# Patient Record
Sex: Female | Born: 1965
Health system: Southern US, Community
[De-identification: ages and names within clinical notes are randomized; demographics above are authoritative.]

## PROBLEM LIST (undated history)

## (undated) DIAGNOSIS — Z1371 Encounter for nonprocreative screening for genetic disease carrier status: Secondary | ICD-10-CM

## (undated) DIAGNOSIS — E785 Hyperlipidemia, unspecified: Secondary | ICD-10-CM

## (undated) DIAGNOSIS — M1990 Unspecified osteoarthritis, unspecified site: Secondary | ICD-10-CM

## (undated) HISTORY — PX: CERVICAL FUSION: SHX112

## (undated) HISTORY — DX: Unspecified osteoarthritis, unspecified site: M19.90

## (undated) HISTORY — DX: Hyperlipidemia, unspecified: E78.5

## (undated) HISTORY — DX: Encounter for nonprocreative screening for genetic disease carrier status: Z13.71

---

## 1997-09-18 ENCOUNTER — Other Ambulatory Visit: Admission: RE | Admit: 1997-09-18 | Discharge: 1997-09-18 | Payer: Self-pay | Admitting: Obstetrics and Gynecology

## 1997-12-13 ENCOUNTER — Inpatient Hospital Stay (HOSPITAL_COMMUNITY): Admission: AD | Admit: 1997-12-13 | Discharge: 1997-12-16 | Payer: Self-pay | Admitting: Gynecology

## 1998-01-30 ENCOUNTER — Other Ambulatory Visit: Admission: RE | Admit: 1998-01-30 | Discharge: 1998-01-30 | Payer: Self-pay | Admitting: Gynecology

## 1999-07-17 ENCOUNTER — Other Ambulatory Visit: Admission: RE | Admit: 1999-07-17 | Discharge: 1999-07-17 | Payer: Self-pay | Admitting: Gynecology

## 2000-05-26 ENCOUNTER — Other Ambulatory Visit: Admission: RE | Admit: 2000-05-26 | Discharge: 2000-05-26 | Payer: Self-pay | Admitting: Gynecology

## 2000-08-22 ENCOUNTER — Inpatient Hospital Stay (HOSPITAL_COMMUNITY): Admission: AD | Admit: 2000-08-22 | Discharge: 2000-08-22 | Payer: Self-pay | Admitting: Gynecology

## 2000-10-28 ENCOUNTER — Encounter: Payer: Self-pay | Admitting: Gynecology

## 2000-10-29 ENCOUNTER — Inpatient Hospital Stay (HOSPITAL_COMMUNITY): Admission: AD | Admit: 2000-10-29 | Discharge: 2000-10-30 | Payer: Self-pay | Admitting: Gynecology

## 2000-11-01 ENCOUNTER — Inpatient Hospital Stay (HOSPITAL_COMMUNITY): Admission: AD | Admit: 2000-11-01 | Discharge: 2000-11-01 | Payer: Self-pay | Admitting: Obstetrics

## 2000-12-02 ENCOUNTER — Inpatient Hospital Stay (HOSPITAL_COMMUNITY): Admission: AD | Admit: 2000-12-02 | Discharge: 2000-12-05 | Payer: Self-pay | Admitting: Gynecology

## 2001-01-18 ENCOUNTER — Other Ambulatory Visit: Admission: RE | Admit: 2001-01-18 | Discharge: 2001-01-18 | Payer: Self-pay | Admitting: Gynecology

## 2002-01-22 ENCOUNTER — Other Ambulatory Visit: Admission: RE | Admit: 2002-01-22 | Discharge: 2002-01-22 | Payer: Self-pay | Admitting: Gynecology

## 2003-07-15 ENCOUNTER — Other Ambulatory Visit: Admission: RE | Admit: 2003-07-15 | Discharge: 2003-07-15 | Payer: Self-pay | Admitting: Gynecology

## 2004-09-30 ENCOUNTER — Other Ambulatory Visit: Admission: RE | Admit: 2004-09-30 | Discharge: 2004-09-30 | Payer: Self-pay | Admitting: Gynecology

## 2006-02-15 ENCOUNTER — Other Ambulatory Visit: Admission: RE | Admit: 2006-02-15 | Discharge: 2006-02-15 | Payer: Self-pay | Admitting: Gynecology

## 2007-11-20 ENCOUNTER — Other Ambulatory Visit: Admission: RE | Admit: 2007-11-20 | Discharge: 2007-11-20 | Payer: Self-pay | Admitting: Gynecology

## 2008-02-28 ENCOUNTER — Ambulatory Visit: Payer: Self-pay | Admitting: Internal Medicine

## 2008-11-14 ENCOUNTER — Ambulatory Visit: Payer: Self-pay | Admitting: Family Medicine

## 2008-11-14 DIAGNOSIS — R21 Rash and other nonspecific skin eruption: Secondary | ICD-10-CM | POA: Insufficient documentation

## 2008-11-14 DIAGNOSIS — K589 Irritable bowel syndrome without diarrhea: Secondary | ICD-10-CM | POA: Insufficient documentation

## 2008-11-29 ENCOUNTER — Encounter: Payer: Self-pay | Admitting: Gynecology

## 2008-11-29 ENCOUNTER — Ambulatory Visit: Payer: Self-pay | Admitting: Gynecology

## 2008-11-29 ENCOUNTER — Other Ambulatory Visit: Admission: RE | Admit: 2008-11-29 | Discharge: 2008-11-29 | Payer: Self-pay | Admitting: Gynecology

## 2010-02-25 ENCOUNTER — Other Ambulatory Visit
Admission: RE | Admit: 2010-02-25 | Discharge: 2010-02-25 | Payer: Self-pay | Source: Home / Self Care | Admitting: Gynecology

## 2010-02-25 ENCOUNTER — Ambulatory Visit: Payer: Self-pay | Admitting: Gynecology

## 2010-03-18 ENCOUNTER — Ambulatory Visit: Payer: Self-pay | Admitting: Gynecology

## 2010-08-14 NOTE — Discharge Summary (Signed)
[facility]  Patient:    Lauren Bernard, Lauren Bernard Visit Number: [id number] MRN: [id number]          Service Type: OBS Location: 910A 9130 01 Attending Physician:  Tonye Royalty Dictated by:   Antony Contras, N.P. Admit Date:  12/02/2000 Discharge Date: 12/05/2000                             Discharge Summary  DISCHARGE DIAGNOSES:          Intrauterine pregnancy at 36 4/7 weeks with abdominopelvic pressure and advanced cervical dilatation.  PROCEDURE:                    Normal spontaneous vaginal delivery of viable infant over intact perineum with repair of first degree laceration.  HISTORY OF PRESENT ILLNESS:   Patient is a 45 year old gravida 2, para 1-0-0-1 with LMP March 22, 2000, [facility] December 27, 2000.  Prenatal risk factors: Patient is Rh negative.  PRENATAL LABORATORIES:        Blood type O-.  Antibody screen negative.  RPR, HBSAG, HIV nonreactive.  MSAFP normal.  HOSPITAL COURSE:              Patient was admitted on December 02, 2000 secondary to advanced cervical dilatation and abdominopelvic pressure.  Cervix was 3 cm, 80-90% effaced, -1 station with bulging membranes and bloody show. She did progress to complete dilatation.  Delivered an Apgar 9/9 8 pound female infant over an intact perineum with repair of first degree laceration. Postpartum she remained afebrile.  Had no difficulty voiding.  Was able to be discharged on her second postpartum day.  Baby was A+ so she did receive RhoGAM prior to discharge.  LABORATORIES:                 CBC:  Hematocrit 27.1, hemoglobin 9.3, platelets 147,000.  DISPOSITION:                  Follow-up in six weeks.  Continue prenatal vitamins and iron, Motrin and Tylox for pain. Dictated by:   Antony Contras, N.P. Attending Physician:  Tonye Royalty DD:  12/23/00 TD:  12/23/00 Job: [phone] [id number]

## 2010-08-14 NOTE — H&P (Signed)
[facility]  PatientROMAN, SANDALL Visit Number: [id number] MRN: [id number]          Service Type: Attending:  Gaetano Hawthorne. Lily Peer, M.D. Dictated by:   Gaetano Hawthorne Lily Peer, M.D. Adm. Date:  12/02/00                           History and Physical  CHIEF COMPLAINT:              Abdominopelvic pressure and advanced cervical dilatation.  HISTORY:                      The patient is a 45 year old gravida 2, para 1. Last menstrual period March 22, 2000.  Estimated date of confinement December 27, 2000.  The patient currently 36-4/[redacted] weeks gestation.  Presented to the office today after calling early this morning that she had been complaining of low abdominopelvic pressure.  She lives approximately an hour and a half from the hospital and had been seen in the office on September 5 and pelvic examination at that time had demonstrated her cervix was 1 cm, 80%, effaced, posterior, -1 station.  The patient has a history of macrosomic infant.  Last pregnancy delivered at 37 weeks of an 8 pound 11 ounce baby. Today, she was placed on the monitor this morning and she was now found to be contracting and her fetal heart rate tracing was reassuring.  She was asked to come back this afternoon to reexamine her cervix.  In the morning, it was 2 cm, and now this afternoon had changed to 3+, 80-90%, and an -1 station with intact membranes and bloody show was evident.  The patient yesterday had a group B Strep culture, pending at time of this dictation.  The patients prenatal course had otherwise been uneventful.  She did receive RhoGAM at [redacted] weeks gestation.  ALLERGIES:                    AMOXICILLIN.  PAST MEDICAL HISTORY:         She had a normal spontaneous vaginal delivery at [redacted] weeks gestation in 1999, 8 pounds 11 ounce baby.  Otherwise, benign past medical history.  REVIEW OF SYSTEMS:            See hospital form.  PHYSICAL EXAMINATION:  GENERAL:                       Well-developed, well-nourished female.  HEENT:                        Unremarkable.  NECK:                         Supple.  Trachea midline.  No carotid bruits, no thyromegaly.  LUNGS:                        Clear to auscultation without rhonchi or wheezes.  HEART:                        Regular rate and rhythm.  No murmurs of gallops.  BREASTS:                      Exam not done.  ABDOMEN:  Gravid uterus.  Fundal height approximately 38-39 cm.  Vertex presentation by [facility] maneuver.  Positive fetal heart tones.  PELVIC:                       Cervix 3+ cm, 80-90% effaced, -3 station, bloody show present, bulging membranes.  EXTREMITIES:                  DTRs 1+, negative clonus.  PRENATAL LABORATORY DATA:     Blood type O negative.  Negative antibody screen.  VDRL was nonreactive.  Rubella immune.  Hepatitis B surface antigen and HIV were negative.  ______ alpha-fetoprotein was normal.  Blood sugar screen was normal.  Pap smear was normal.  GBS culture pending at time of this dictation.  ASSESSMENT:                   A 45 year old gravida 2, para 1 at 36-4/[redacted] weeks gestation and complaining of persistent low abdominopelvic pressure with evidence of advanced cervical dilatation from this mornings exam to this afternoon, now 3+ cm, 80-90% effaced, -1 station with bulging membranes and bloody show.  GBS culture obtained yesterday pending at time of this dictation.  The patient monitored and no evidence of contractions earlier but reassuring tracing.  The patient will be admitted to [facility], will be monitored, possibly could proceed with initiation of labor due to the fact that geographical distance is over an hour and a half to the hospital and she has changed her cervix significantly and, with her symptoms, warrants delivery, such as rupture of membranes and Pitocin augmentation as indicated. I have explained to the patient that she is  36-1/2 weeks, almost 37 weeks, that term pregnancy is considered between 36-[redacted] weeks gestation, that there is always a risk of the baby being delivered prematurely and having lung immaturity but she has good dates and has had normal diabetes screen and that more than likely this baby will do well.  The patient feels comfortable with that and we will go ahead and admit, and if her GBS cultures are not available at this time, we will go ahead and place her on a penicillin G protocol for prophylaxis.  PLAN:                         Process with above. Dictated by:   Gaetano Hawthorne Lily Peer, M.D. Attending:  Gaetano Hawthorne. Lily Peer, M.D. DD:  12/02/00 TD:  12/02/00 Job: [id number]

## 2010-08-14 NOTE — H&P (Signed)
[facility]  Patient:    Lauren Bernard, Lauren Bernard                        MRN: [id number] Adm. Date:  [id number] Attending:  Merrily Pew                         History and Physical  CHIEF COMPLAINT:              Third trimester bleeding.  HISTORY OF PRESENT ILLNESS:   A 45 year old, G2, P66 female at 31+ weeks who enters complaining of asymptomatic acute vaginal bleeding.  The patient relates unprovoked episode of bright red bleeding per vagina like a menstrual period.  No associated cramping pain with this and no precipitating events as far as falls or injuries.  The patient had no recent intercourse or any other identifiable cause.  Her pregnancy has been uncomplicated to date, and she reports a history to have had a normal screening ultrasound showing no placental abnormalities or previa.  PAST MEDICAL HISTORY:         IBS.  PAST SURGICAL HISTORY:        Wisdom teeth.  ALLERGIES:                    AMOXICILLIN causing rash.  REVIEW OF SYSTEMS:            Noncontributory.  MEDICATIONS:                  Prenatal vitamins and iron.  SOCIAL HISTORY:               Noncontributory.  No cigarette or alcohol use.  ADMISSION PHYSICAL EXAMINATION:  VITAL SIGNS:                  Afebrile.  Vital signs are stable.  HEENT:                        Normal.  LUNGS:                        Clear.  CARDIAC:                      Regular rate without rubs, murmurs, or gallops.  ABDOMEN:                      Uterus appropriate for dates, soft, nontender. External monitor showing no contractions with a reactive fetal tracing.  PELVIC:                       On peroneal inspection, no blood.  Gentle digital exam reveals menses type staining.  With gentle cervix palpation, closed with palpable length.  ASSESSMENT:                   A 45 year old, G2, P63 female at 78 weeks with third trimester bleeding, not actively bleeding now but with evidence of blood on  exam.  No provoked etiology and otherwise asymptomatic.  Differential includes abruption, although the patient is not having pain or contractions. Second is abnormality in placenta location to include possible succenturiate lobe versus marginal previa.  The third being cervical with possible rupture of a cervical vessel.  PLAN:  The patient is to be admitted for observation and continued fetal uterine monitoring, CBC baseline, Kleihauer-Betke type and Rh.  An ultrasound for placenta location and assessment for abruption. Discussed plan with patient who agrees, and her questions were answered. DD:  10/28/00 TD:  10/28/00 Job: [id number]

## 2012-03-10 ENCOUNTER — Encounter: Payer: Self-pay | Admitting: Gynecology

## 2012-03-30 ENCOUNTER — Encounter: Payer: Self-pay | Admitting: Gynecology

## 2012-04-11 ENCOUNTER — Encounter: Payer: Self-pay | Admitting: Gynecology

## 2012-04-28 ENCOUNTER — Encounter: Payer: Self-pay | Admitting: Gynecology

## 2012-04-28 ENCOUNTER — Ambulatory Visit (INDEPENDENT_AMBULATORY_CARE_PROVIDER_SITE_OTHER): Payer: BC Managed Care – PPO | Admitting: Gynecology

## 2012-04-28 ENCOUNTER — Other Ambulatory Visit (HOSPITAL_COMMUNITY)
Admission: RE | Admit: 2012-04-28 | Discharge: 2012-04-28 | Disposition: A | Discharge status: Home / Self Care | Payer: BC Managed Care – PPO | Source: Ambulatory Visit | Attending: Gynecology | Admitting: Gynecology

## 2012-04-28 VITALS — BP 120/72 | Ht 63.0 in | Wt 132.0 lb

## 2012-04-28 DIAGNOSIS — Z01419 Encounter for gynecological examination (general) (routine) without abnormal findings: Secondary | ICD-10-CM

## 2012-04-28 DIAGNOSIS — Z13220 Encounter for screening for lipoid disorders: Secondary | ICD-10-CM

## 2012-04-28 DIAGNOSIS — Z1151 Encounter for screening for human papillomavirus (HPV): Secondary | ICD-10-CM | POA: Insufficient documentation

## 2012-04-28 DIAGNOSIS — F411 Generalized anxiety disorder: Secondary | ICD-10-CM

## 2012-04-28 DIAGNOSIS — F418 Other specified anxiety disorders: Secondary | ICD-10-CM

## 2012-04-28 DIAGNOSIS — M1990 Unspecified osteoarthritis, unspecified site: Secondary | ICD-10-CM | POA: Insufficient documentation

## 2012-04-28 LAB — COMPREHENSIVE METABOLIC PANEL
ALT: 22 U/L (ref 0–35)
Albumin: 4.4 g/dL (ref 3.5–5.2)
Alkaline Phosphatase: 67 U/L (ref 39–117)
CO2: 27 mEq/L (ref 19–32)
Calcium: 9.4 mg/dL (ref 8.4–10.5)
Chloride: 105 mEq/L (ref 96–112)
Potassium: 4 mEq/L (ref 3.5–5.3)
Sodium: 139 mEq/L (ref 135–145)
Total Bilirubin: 0.8 mg/dL (ref 0.3–1.2)
Total Protein: 6.6 g/dL (ref 6.0–8.3)

## 2012-04-28 LAB — CBC WITH DIFFERENTIAL/PLATELET
Basophils Absolute: 0 10*3/uL (ref 0.0–0.1)
Basophils Relative: 0 % (ref 0–1)
Eosinophils Absolute: 0 10*3/uL (ref 0.0–0.7)
Eosinophils Relative: 1 % (ref 0–5)
HCT: 36.9 % (ref 36.0–46.0)
Hemoglobin: 12.8 g/dL (ref 12.0–15.0)
Lymphocytes Relative: 31 % (ref 12–46)
Lymphs Abs: 2.1 10*3/uL (ref 0.7–4.0)
MCH: 31.2 pg (ref 26.0–34.0)
MCHC: 34.7 g/dL (ref 30.0–36.0)
MCV: 90 fL (ref 78.0–100.0)
Monocytes Absolute: 0.3 10*3/uL (ref 0.1–1.0)
Monocytes Relative: 4 % (ref 3–12)
Neutro Abs: 4.5 10*3/uL (ref 1.7–7.7)
Neutrophils Relative %: 64 % (ref 43–77)
Platelets: 292 10*3/uL (ref 150–400)
RBC: 4.1 MIL/uL (ref 3.87–5.11)
RDW: 12.8 % (ref 11.5–15.5)
WBC: 7 10*3/uL (ref 4.0–10.5)

## 2012-04-28 LAB — LIPID PANEL
Cholesterol: 179 mg/dL (ref 0–200)
HDL: 43 mg/dL (ref >39)
LDL Cholesterol: 116 mg/dL — ABNORMAL HIGH (ref 0–99)
Triglycerides: 98 mg/dL (ref <150)
VLDL: 20 mg/dL (ref 0–40)

## 2012-04-28 MED ORDER — SERTRALINE HCL 50 MG PO TABS: 50.0000 mg | ORAL_TABLET | Freq: Every day | ORAL | Status: DC

## 2012-04-28 NOTE — Progress Notes (Signed)
Lauren Bernard March 17, 1966 [id number]        47 y.o.  G2P2002 for annual exam.  Has not been in for 2 years. Several issues noted below.  Past medical history,surgical history, medications, allergies, family history and social history were all reviewed and documented in the EPIC chart. ROS:  Was performed and pertinent positives and negatives are included in the history.  Exam: Kim assistant Filed Vitals:   04/28/12 1047  BP: 120/72  Height: 5\' 3"  (1.6 m)  Weight: 132 lb (59.875 kg)   General appearance  Normal Skin grossly normal Head/Neck normal with no cervical or supraclavicular adenopathy thyroid normal Lungs  clear Cardiac RR, without RMG Abdominal  soft, nontender, without masses, organomegaly or hernia Breasts  examined lying and sitting without masses, retractions, discharge or axillary adenopathy. Pelvic  Ext/BUS/vagina  normal   Cervix  normal Pap/HPV  Uterus  anteverted, normal size, shape and contour, midline and mobile nontender   Adnexa  Without masses or tenderness    Anus and perineum  normal   Rectovaginal  normal sphincter tone without palpated masses or tenderness.    Assessment/Plan:  47 y.o. G6P2002 female for annual exam regular menses, condom birth control.   1. Contraception management. I reviewed options for contraception and again discussed the failure risk with condoms. Availability of plan B also reviewed. Patient is not interested in trying anything else. She understands and accepts the failure risks. 2. Situational anxiety. Patient's having a lot of issues with situational anxiety to include a brother who was recently hospitalized for drug abuse and issues at work. Had been on Zoloft in the past around the time of her mother's death and did well with this and asked if she could restart this. I reviewed the risks benefits to include suicide ideations. She understands and accepts and we'll start with Zoloft 50 mg daily #30 refill x11. Recommended she stay  on this at least 6 months to year and then we can reevaluate at that time. 3. Mammography 2012. Is arranging her mammogram now.  I recommended 3-D mammography. Her mother developed breast cancer at age 44 and died at 25. Her mother's sister developed breast cancer at age 60 is alive and well after double mastectomy. We have discussed in the past and I rediscussed today genetic testing for BRCA carrier status. I offered to draw that today she declined at this time. I strongly recommended she consider genetic counseling to assess her risk not only for BRCA testing but also MRI screening and if she is above a 15 to 20% risk to consider annual MRI. Patient gets her studies at [facility] and said that she will call there and arranged to talk to a genetic counselor. She does understand 3-D mammography is not to replace MRI screening. SBE monthly also reviewed. 4. Pap smear 2011. Pap/HPV done today. No history of abnormal Pap smears previously and if this is normal we'll plan less frequent screening every 3-5 years. 5. Health maintenance. Baseline CBC lipid profile comprehensive metabolic panel urinalysis ordered. Follow up in one year, sooner as needed.    Dara Lords MD, 12:01 PM 04/28/2012

## 2012-04-28 NOTE — Patient Instructions (Signed)
Start on Zoloft as we discussed. Call me if they have any issues with this. Consider genetic testing for breast cancer gene or talking to a genetic counselor as far as getting tested as well as doing MRIs.  Would recommend 3-D mammography regardless. Follow up in one year for annual exam

## 2012-04-29 LAB — URINALYSIS W MICROSCOPIC + REFLEX CULTURE
Bacteria, UA: NONE SEEN
Bilirubin Urine: NEGATIVE
Casts: NONE SEEN
Crystals: NONE SEEN
Glucose, UA: NEGATIVE mg/dL
Hgb urine dipstick: NEGATIVE
Ketones, ur: NEGATIVE mg/dL
Leukocytes, UA: NEGATIVE
Nitrite: NEGATIVE
Protein, ur: NEGATIVE mg/dL
Specific Gravity, Urine: <1.005 — ABNORMAL LOW (ref 1.005–1.030)
Squamous Epithelial / LPF: NONE SEEN
Urobilinogen, UA: 0.2 mg/dL (ref 0.0–1.0)
pH: 7.5 (ref 5.0–8.0)

## 2012-05-01 ENCOUNTER — Telehealth: Payer: Self-pay | Admitting: *Deleted

## 2012-05-01 NOTE — Telephone Encounter (Signed)
Mammogram order faxed to [facility] for pt @ [phone]. Pt informed as well.

## 2012-05-02 ENCOUNTER — Other Ambulatory Visit: Payer: Self-pay

## 2012-05-02 ENCOUNTER — Other Ambulatory Visit: Payer: Self-pay | Admitting: Gynecology

## 2012-05-02 MED ORDER — FLUCONAZOLE 150 MG PO TABS: 150.0000 mg | ORAL_TABLET | Freq: Once | ORAL | Status: DC

## 2012-05-19 ENCOUNTER — Encounter: Payer: Self-pay | Admitting: Gynecology

## 2013-04-18 ENCOUNTER — Ambulatory Visit (INDEPENDENT_AMBULATORY_CARE_PROVIDER_SITE_OTHER): Payer: BC Managed Care – PPO | Admitting: Gynecology

## 2013-04-18 ENCOUNTER — Encounter: Payer: Self-pay | Admitting: Gynecology

## 2013-04-18 DIAGNOSIS — M545 Low back pain, unspecified: Secondary | ICD-10-CM

## 2013-04-18 LAB — URINALYSIS W MICROSCOPIC + REFLEX CULTURE
Bilirubin Urine: NEGATIVE
Casts: NONE SEEN
Crystals: NONE SEEN
Glucose, UA: NEGATIVE mg/dL
KETONES UR: NEGATIVE mg/dL
Leukocytes, UA: NEGATIVE
NITRITE: NEGATIVE
Protein, ur: NEGATIVE mg/dL
RBC / HPF: NONE SEEN RBC/hpf (ref <3)
Specific Gravity, Urine: <1.005 — ABNORMAL LOW (ref 1.005–1.030)
UROBILINOGEN UA: 0.2 mg/dL (ref 0.0–1.0)
WBC, UA: NONE SEEN WBC/hpf (ref <3)
pH: 5 (ref 5.0–8.0)

## 2013-04-18 MED ORDER — SULFAMETHOXAZOLE-TMP DS 800-160 MG PO TABS: 1.0000 | ORAL_TABLET | Freq: Two times a day (BID) | ORAL | Status: DC

## 2013-04-18 NOTE — Progress Notes (Signed)
Patient presents with several day history of low back pain and malaise. No temperatures. Slight right lower quadrant discomfort. No nausea vomiting diarrhea or constipation. No dysuria frequency urgency. Feels like she may be getting an early UTI. Has had similar symptoms in the past.  Exam was [employer] Spine straight without CVA tenderness. Abdomen soft nontender without masses guarding rebound organomegaly. Pelvic external BUS vagina normal. Cervix normal. Uterus retroverted grossly normal size midline mobile nontender. Adnexa without masses or tenderness.  Assessment and plan: Ill-defined symptoms. Urinalysis does show rare bacteria with trace blood. Will check culture. Start on Septra DS 1 by mouth twice a day x3 days. If symptoms persist patient will call and we'll start with ultrasound to rule out nonpalpable pelvic pathology. If symptoms totally cleared and she is going to followup in February when she is due for her annual exam.

## 2013-04-18 NOTE — Patient Instructions (Signed)
Take Septra antibiotic twice daily for 3 days. If discomfort or symptoms persist call and we will schedule an ultrasound. If the symptoms totally disappeared annual followup in February when you are due for your annual exam.

## 2013-04-19 ENCOUNTER — Telehealth: Payer: Self-pay | Admitting: *Deleted

## 2013-04-19 LAB — URINE CULTURE
COLONY COUNT: NO GROWTH
ORGANISM ID, BACTERIA: NO GROWTH

## 2013-04-19 NOTE — Telephone Encounter (Signed)
Pt calling to follow up from [place] 04/18/13 took Septra DS 1 by mouth twice a day x3 still having lower back discomfort and pressure on right side. Please advise

## 2013-04-19 NOTE — Telephone Encounter (Signed)
Pt gave DPR access so I left the below on pt voicemail.

## 2013-04-19 NOTE — Telephone Encounter (Signed)
She is to take Septra twice daily for 3 days and she started that yesterday she should still have 2 days left. I told her that that would take her into the weekend if her pain still continued on Monday to call and we would schedule an ultrasound. Her urine culture is still not back yet. If she starts feeling worse in the interim that she would need to be seen at an emergency facility like urgent care or emergency room

## 2013-04-23 ENCOUNTER — Telehealth: Payer: Self-pay | Admitting: *Deleted

## 2013-04-23 DIAGNOSIS — R1031 Right lower quadrant pain: Secondary | ICD-10-CM

## 2013-04-23 NOTE — Telephone Encounter (Signed)
Okay to schedule ultrasound 

## 2013-04-23 NOTE — Telephone Encounter (Signed)
Pt informed, front desk to call, order placed

## 2013-04-23 NOTE — Telephone Encounter (Signed)
Pt called to follow up from telephone encounter 04/19/13 "I told her that that would take her into the weekend if her pain still continued on Monday to call and we would schedule an ultrasound. Patient is still having pain ultrasound will be scheduled. Lauren Bernard

## 2013-04-25 ENCOUNTER — Other Ambulatory Visit: Payer: Self-pay | Admitting: Gynecology

## 2013-04-25 ENCOUNTER — Ambulatory Visit (INDEPENDENT_AMBULATORY_CARE_PROVIDER_SITE_OTHER): Payer: BC Managed Care – PPO | Admitting: Gynecology

## 2013-04-25 ENCOUNTER — Ambulatory Visit (INDEPENDENT_AMBULATORY_CARE_PROVIDER_SITE_OTHER): Payer: BC Managed Care – PPO

## 2013-04-25 ENCOUNTER — Encounter: Payer: Self-pay | Admitting: Gynecology

## 2013-04-25 DIAGNOSIS — R9389 Abnormal findings on diagnostic imaging of other specified body structures: Secondary | ICD-10-CM

## 2013-04-25 DIAGNOSIS — R188 Other ascites: Secondary | ICD-10-CM

## 2013-04-25 DIAGNOSIS — N839 Noninflammatory disorder of ovary, fallopian tube and broad ligament, unspecified: Secondary | ICD-10-CM

## 2013-04-25 DIAGNOSIS — R1031 Right lower quadrant pain: Secondary | ICD-10-CM

## 2013-04-25 DIAGNOSIS — N83209 Unspecified ovarian cyst, unspecified side: Secondary | ICD-10-CM

## 2013-04-25 DIAGNOSIS — D259 Leiomyoma of uterus, unspecified: Secondary | ICD-10-CM

## 2013-04-25 DIAGNOSIS — D251 Intramural leiomyoma of uterus: Secondary | ICD-10-CM

## 2013-04-25 DIAGNOSIS — R102 Pelvic and perineal pain: Secondary | ICD-10-CM

## 2013-04-25 DIAGNOSIS — N831 Corpus luteum cyst of ovary, unspecified side: Secondary | ICD-10-CM

## 2013-04-25 DIAGNOSIS — N949 Unspecified condition associated with female genital organs and menstrual cycle: Secondary | ICD-10-CM

## 2013-04-25 LAB — CBC WITH DIFFERENTIAL/PLATELET
Basophils Absolute: 0.1 10*3/uL (ref 0.0–0.1)
Basophils Relative: 1 % (ref 0–1)
EOS ABS: 0.1 10*3/uL (ref 0.0–0.7)
Eosinophils Relative: 1 % (ref 0–5)
HCT: 38.1 % (ref 36.0–46.0)
Hemoglobin: 13.2 g/dL (ref 12.0–15.0)
LYMPHS ABS: 2.1 10*3/uL (ref 0.7–4.0)
LYMPHS PCT: 35 % (ref 12–46)
MCH: 31.1 pg (ref 26.0–34.0)
MCHC: 34.6 g/dL (ref 30.0–36.0)
MCV: 89.9 fL (ref 78.0–100.0)
Monocytes Absolute: 0.3 10*3/uL (ref 0.1–1.0)
Monocytes Relative: 5 % (ref 3–12)
NEUTROS ABS: 3.5 10*3/uL (ref 1.7–7.7)
NEUTROS PCT: 58 % (ref 43–77)
PLATELETS: 320 10*3/uL (ref 150–400)
RBC: 4.24 MIL/uL (ref 3.87–5.11)
RDW: 12.7 % (ref 11.5–15.5)
WBC: 6.1 10*3/uL (ref 4.0–10.5)

## 2013-04-25 MED ORDER — IBUPROFEN 800 MG PO TABS: 800.0000 mg | ORAL_TABLET | Freq: Three times a day (TID) | ORAL | Status: DC | PRN

## 2013-04-25 NOTE — Patient Instructions (Signed)
Start on prescription strength ibuprofen every 8 hours. Followup next week for annual exam. We will plan on repeating the ultrasound in several months just to make sure the ovarian cystic changes resolve.

## 2013-04-25 NOTE — Progress Notes (Signed)
Patient follows up having had been evaluated last week with low back pain and malaise. Was started on Septra for presumed UTI although her urine did not grow out any bacteria ultimately. Her low back pain radiating to her pelvis has continued and she called and we scheduled an ultrasound. Regular menses. No irregular bleeding. No fever chills nausea vomiting diarrhea or constipation. No urinary symptoms. Does note she is feeling a little bit better since last week.  Ultrasound shows uterus normal size. Endometrial echo generous at 24.9 mm. Right ovary with thick cystic reticular mass consistent with a hemorrhagic cyst 24 x 23 x 20 mm. Avascular. Left ovary with thick walled cyst peripheral flow 23 x 22 x 19 consistent with corpus luteum. Moderate amount of free fluid in the cul-de-sac.  Exam Spine straight without CVA tenderness. Abdomen soft nontender without masses guarding rebound organomegaly.  Assessment and plan: Low back pain with pelvic discomfort. Suspect ruptured ovarian cyst. Ibuprofen 800 mg #30 refill x1 every 8 hours. Check baseline labs to include CBC comprehensive metabolic panel hCG and repeat urinalysis. Patient has appointment for her annual next week and will followup for this assuming she continues to feel better. Will plan on repeating her ultrasound in several cycles just to make sure the endometrial echo thins and the cystic changes resolved.

## 2013-04-26 LAB — COMPREHENSIVE METABOLIC PANEL
ALT: 13 U/L (ref 0–35)
AST: 18 U/L (ref 0–37)
Albumin: 4.6 g/dL (ref 3.5–5.2)
Alkaline Phosphatase: 63 U/L (ref 39–117)
BUN: 12 mg/dL (ref 6–23)
CALCIUM: 9.3 mg/dL (ref 8.4–10.5)
CHLORIDE: 103 meq/L (ref 96–112)
CO2: 27 mEq/L (ref 19–32)
CREATININE: 0.78 mg/dL (ref 0.50–1.10)
Glucose, Bld: 91 mg/dL (ref 70–99)
POTASSIUM: 4.5 meq/L (ref 3.5–5.3)
Sodium: 138 mEq/L (ref 135–145)
Total Bilirubin: 0.5 mg/dL (ref 0.2–1.2)
Total Protein: 7.1 g/dL (ref 6.0–8.3)

## 2013-04-26 LAB — URINALYSIS W MICROSCOPIC + REFLEX CULTURE
Bacteria, UA: NONE SEEN
Bilirubin Urine: NEGATIVE
Casts: NONE SEEN
Crystals: NONE SEEN
GLUCOSE, UA: NEGATIVE mg/dL
Hgb urine dipstick: NEGATIVE
Ketones, ur: NEGATIVE mg/dL
LEUKOCYTES UA: NEGATIVE
Nitrite: NEGATIVE
PROTEIN: NEGATIVE mg/dL
Squamous Epithelial / LPF: NONE SEEN
Urobilinogen, UA: 0.2 mg/dL (ref 0.0–1.0)
pH: 7 (ref 5.0–8.0)

## 2013-04-26 LAB — HCG, SERUM, QUALITATIVE: PREG SERUM: NEGATIVE

## 2013-05-02 ENCOUNTER — Ambulatory Visit (INDEPENDENT_AMBULATORY_CARE_PROVIDER_SITE_OTHER): Payer: BC Managed Care – PPO | Admitting: Gynecology

## 2013-05-02 ENCOUNTER — Encounter: Payer: Self-pay | Admitting: Gynecology

## 2013-05-02 VITALS — BP 120/76 | Ht 63.0 in | Wt 148.0 lb

## 2013-05-02 DIAGNOSIS — Z01419 Encounter for gynecological examination (general) (routine) without abnormal findings: Secondary | ICD-10-CM

## 2013-05-02 LAB — LIPID PANEL
Cholesterol: 197 mg/dL (ref 0–200)
HDL: 49 mg/dL (ref >39)
LDL Cholesterol: 128 mg/dL — ABNORMAL HIGH (ref 0–99)
TRIGLYCERIDES: 101 mg/dL (ref <150)
Total CHOL/HDL Ratio: 4 Ratio
VLDL: 20 mg/dL (ref 0–40)

## 2013-05-02 NOTE — Patient Instructions (Addendum)
Office will call to arrange genetic testing  Followup for ultrasound in 2 months  Call to Schedule your mammogram  Facilities in [place]: 1)  [facility], [place] Phone: [phone] 2)  [facility]. [address] [place] Phone: [phone] 3)  Dr. Isaiah Blakes at [facility] [address] Phone: [phone]     Mammogram A mammogram is an X-ray test to find changes in a woman's breast. You should get a mammogram if:  You are 36 years of age or older  You have risk factors.   Your doctor recommends that you have one.  BEFORE THE TEST  Do not schedule the test the week before your period, especially if your breasts are sore during this time.  On the day of your mammogram:  Wash your breasts and armpits well. After washing, do not put on any deodorant or talcum powder on until after your test.   Eat and drink as you usually do.   Take your medicines as usual.   If you are diabetic and take insulin, make sure you:   Eat before coming for your test.   Take your insulin as usual.   If you cannot keep your appointment, call before the appointment to cancel. Schedule another appointment.  TEST  You will need to undress from the waist up. You will put on a hospital gown.   Your breast will be put on the mammogram machine, and it will press firmly on your breast with a piece of plastic called a compression paddle. This will make your breast flatter so that the machine can X-ray all parts of your breast.   Both breasts will be X-rayed. Each breast will be X-rayed from above and from the side. An X-ray might need to be taken again if the picture is not good enough.   The mammogram will last about 15 to 30 minutes.  AFTER THE TEST Finding out the results of your test Ask when your test results will be ready. Make sure you get your test results.  Document Released:  06/11/2008 Document Revised: 03/04/2011 Document Reviewed: 06/11/2008 [facility] Patient Information 2012 [place].

## 2013-05-02 NOTE — Progress Notes (Signed)
MAHEALANI SULAK 11-27-1965 [id number]        48 y.o.  G2P2002 for annual exam.  Several issues noted below.  Past medical history,surgical history, problem list, medications, allergies, family history and social history were all reviewed and documented in the EPIC chart.  ROS:  Performed and pertinent positives and negatives are included in the history, assessment and plan .  Exam: Kim assistant Filed Vitals:   05/02/13 0854  BP: 120/76  Height: 5\' 3"  (1.6 m)  Weight: 148 lb (67.132 kg)   General appearance  Normal Skin grossly normal Head/Neck normal with no cervical or supraclavicular adenopathy thyroid normal Lungs  clear Cardiac RR, without RMG Abdominal  soft, nontender, without masses, organomegaly or hernia Breasts  examined lying and sitting without masses, retractions, discharge or axillary adenopathy. Pelvic  Ext/BUS/vagina  Normal  Cervix  Normal  Uterus  retroverted, normal size, shape and contour, midline and mobile nontender   Adnexa  Without masses or tenderness    Anus and perineum  Normal   Rectovaginal  Normal sphincter tone without palpated masses or tenderness.    Assessment/Plan:  48 y.o. G51P2002 female for annual exam regular menses, condom birth control.   1. Contraception. Patient continues with condoms and is comfortable with this. Risk of failure and availability of plan B. has been discussed with her on multiple occasions. 2. History of lower abdominal/pelvic pain recent evaluation negative. Ultrasound did show hemorrhagic appearing cyst and we felt it was secondary to this. Notes that her pain is getting better. Recommended followup ultrasound in 2 months to make sure the cyst resolves and she agrees to followup for this and schedule it. 3. Strong family history of breast cancer. Mother diagnosed age 48 and subsequently died. Maternal aunt diagnosed age 16. Previously discussed availability of genetic testing and whether to add MRI screening regardless.  Patient never followed up as we discussed last year. I again urged her to at least discuss with genetic counselor from a genetic testing as well as loculated breast cancer risk in MRI screening. Patient agrees to this and we'll arrange this through [facility] [place]. Mammography 04/2012. Continue with annual mammography with suggested 3-D. SBE monthly reviewed. 4. History of anxiety on Zoloft. Patient has weaned herself off and is doing well and will stay off of this. 5. Pap smear/HPV negative 2014. No history of significant abnormal Pap smears. Plan repeat at 3-5 year interval. 6. Health maintenance. Recently had CBC comprehensive metabolic panel urinalysis. Will check lipid profile today. Followup for ultrasound in 2 months otherwise annually.   Note: This document was prepared with digital dictation and possible smart phrase technology. Any transcriptional errors that result from this process are unintentional.   Anastasio Auerbach MD, 9:47 AM 05/02/2013

## 2013-05-03 ENCOUNTER — Telehealth: Payer: Self-pay | Admitting: *Deleted

## 2013-05-03 ENCOUNTER — Telehealth: Payer: Self-pay | Admitting: Genetic Counselor

## 2013-05-03 NOTE — Telephone Encounter (Signed)
S/W PT IN REF TO [facility]. ON 07/16/13@1 :00 MAILED NP PACKET

## 2013-05-03 NOTE — Telephone Encounter (Signed)
Message copied by Thamas Jaegers on Thu May 03, 2013  8:40 AM ------      Message from: Anastasio Auerbach      Created: Wed May 02, 2013  9:35 AM       Schedule appointment with genetic counselor at [facility] Center reference mother with history breast cancer age 46, maternal aunt breast cancer age 68 assess for genetic testing and breast cancer risk ------

## 2013-05-03 NOTE — Telephone Encounter (Signed)
LEFT PT VM IN REF TO [facility]. APPT.

## 2013-05-03 NOTE — Telephone Encounter (Signed)
Referral faxed to [facility], they will contact pt to schedule. 

## 2013-05-03 NOTE — Telephone Encounter (Signed)
Appt. 07/16/13 @ 1:00 pm

## 2013-06-26 ENCOUNTER — Telehealth: Payer: Self-pay | Admitting: *Deleted

## 2013-06-26 NOTE — Telephone Encounter (Signed)
Called and left message for pt to return my call so I can reschedule her genetic appt.

## 2013-07-11 ENCOUNTER — Encounter: Payer: Self-pay | Admitting: Gynecology

## 2013-07-11 ENCOUNTER — Other Ambulatory Visit: Payer: Self-pay | Admitting: Gynecology

## 2013-07-11 ENCOUNTER — Telehealth: Payer: Self-pay

## 2013-07-11 ENCOUNTER — Ambulatory Visit (INDEPENDENT_AMBULATORY_CARE_PROVIDER_SITE_OTHER): Payer: BC Managed Care – PPO | Admitting: Gynecology

## 2013-07-11 ENCOUNTER — Ambulatory Visit (INDEPENDENT_AMBULATORY_CARE_PROVIDER_SITE_OTHER): Payer: BC Managed Care – PPO

## 2013-07-11 DIAGNOSIS — R102 Pelvic and perineal pain unspecified side: Secondary | ICD-10-CM

## 2013-07-11 DIAGNOSIS — N83209 Unspecified ovarian cyst, unspecified side: Secondary | ICD-10-CM

## 2013-07-11 DIAGNOSIS — N831 Corpus luteum cyst of ovary, unspecified side: Secondary | ICD-10-CM

## 2013-07-11 DIAGNOSIS — Z01419 Encounter for gynecological examination (general) (routine) without abnormal findings: Secondary | ICD-10-CM

## 2013-07-11 DIAGNOSIS — N949 Unspecified condition associated with female genital organs and menstrual cycle: Secondary | ICD-10-CM

## 2013-07-11 NOTE — Progress Notes (Signed)
Lauren Bernard May 24, 1965 [id number]        48 y.o.  [id number] presents for followup ultrasound with history of low back pain and pelvic discomfort starting late last year. Had ultrasound in January 2015 which showed a hemorrhagic-appearing cyst on the right ovary 24 x 23 x 20 mm and a more thickwalled hemorrhagic-appearing cyst on the left ovary 23 x 22 x 19. We planned expectant management with repeat ultrasound in several months and she is here for that. She initially felt the pain was getting better but she still is having a nagging pelvic discomfort and more bloating type symptoms. Menses regular using condoms contraception. No nausea vomiting diarrhea constipation. No urinary symptoms such as urgency frequency dysuria.  Past medical history,surgical history, problem list, medications, allergies, family history and social history were all reviewed and documented in the EPIC chart.  Directed ROS to system complaints/issues with pertinent positives and negatives documented in the history of present illness/assessment and plan.  Ultrasound shows uterus normal size. Endometrial echo 18 mm. Small myoma 15 mm noted. Right ovary is normal with physiologic follicular change. Left ovary with thick walled cystic hemorrhagic-appearing cyst 36 x 36 x 30 mm low level internal echoes, avascular. Some fluid noted in the cul-de-sac.  Assessment/Plan:  48 y.o. G2P2002 persistent pelvic discomfort with ultrasound showing a persistent low level internal echo cyst on the left avascular slightly larger than before. Reviewed differential to include physiologic changes, endometriosis with endometrioma, other ovarian processes/tumor, non-gynecologic although no pointing symptoms to other organ systems. After likely discussion we both agree with proceeding with laparoscopy and removal of the left ovarian cyst. Assess pelvis for endometriosis. Will check urinalysis today and CA 125. Will represent for a preop consult before hand.  I discussed in general was involved with the surgery to include laparoscopic ports, risks of damage to internal organs infection and transfusion. She has a meeting scheduled with the genetic counselor in reference to her strong family history of breast cancer. I did discuss that if she would elect for genetic testing that we should wait and do the surgery after results are back because if positive then we may want to strongly consider bilateral prophylactic something oophorectomy. Also using condoms for contraception and if she elects against genetic testing or is found to be heme negative that we may want to proceed with bilateral salpingectomies for birth control and risk reductive surgery. Patient agrees with the plan and she'll followup for the blood results and her genetic testing results as well as her preoperative consult.   Note: This document was prepared with digital dictation and possible smart phrase technology. Any transcriptional errors that result from this process are unintentional.   Anastasio Auerbach MD, 11:22 AM 07/11/2013

## 2013-07-11 NOTE — Telephone Encounter (Signed)
I called patient to schedule surgery as I received a surgery order from Dr. Loetta Rough.  Patient said she and Dr. Loetta Rough had discussed her waiting until she had her genetic testing and had results to schedule surgery. She is going for that next week. She will call me as soon as ready to schedule.

## 2013-07-11 NOTE — Patient Instructions (Signed)
office will call you to arrange surgery.

## 2013-07-12 LAB — URINALYSIS W MICROSCOPIC + REFLEX CULTURE
Bilirubin Urine: NEGATIVE
Casts: NONE SEEN
Crystals: NONE SEEN
Glucose, UA: NEGATIVE mg/dL
Hgb urine dipstick: NEGATIVE
KETONES UR: NEGATIVE mg/dL
Leukocytes, UA: NEGATIVE
NITRITE: NEGATIVE
PROTEIN: NEGATIVE mg/dL
Specific Gravity, Urine: 1.018 (ref 1.005–1.030)
Urobilinogen, UA: 0.2 mg/dL (ref 0.0–1.0)
pH: 6 (ref 5.0–8.0)

## 2013-07-12 LAB — CA 125: CA 125: 15.5 U/mL (ref 0.0–30.2)

## 2013-07-16 ENCOUNTER — Encounter: Payer: Self-pay | Admitting: Genetic Counselor

## 2013-07-16 ENCOUNTER — Other Ambulatory Visit: Payer: BC Managed Care – PPO

## 2013-07-16 ENCOUNTER — Ambulatory Visit (HOSPITAL_BASED_OUTPATIENT_CLINIC_OR_DEPARTMENT_OTHER): Payer: BC Managed Care – PPO | Admitting: Genetic Counselor

## 2013-07-16 DIAGNOSIS — Z803 Family history of malignant neoplasm of breast: Secondary | ICD-10-CM | POA: Insufficient documentation

## 2013-07-16 DIAGNOSIS — Z8041 Family history of malignant neoplasm of ovary: Secondary | ICD-10-CM

## 2013-07-16 NOTE — Progress Notes (Signed)
HISTORY OF PRESENT ILLNESS: Dr. Phineas Real requested a cancer genetics consultation for Lauren Bernard, a 48 y.o. female, due to a family history of breast cancer.  Lauren Bernard presents to clinic today to discuss the possibility of a hereditary predisposition to cancer, genetic testing, and to further clarify her future cancer risks, as well as potential cancer risk for family members. Lauren Bernard has no personal history of cancer. She was accompanied to clinic today by two maternal aunts.  Past Medical History  Diagnosis Date   Arthritis     Toe    History   Social History   Marital Status: Married    Spouse Name: N/A    Number of Children: N/A   Years of Education: N/A   Social History Main Topics   Smoking status: Never Smoker    Smokeless tobacco: Not on file   Alcohol Use: Yes     Comment: Rare   Drug Use: No   Sexual Activity: Yes    Birth Control/ Protection: Condom   Other Topics Concern   Not on file   Social History Narrative   No narrative on file     FAMILY HISTORY:  During the visit, a 4-generation pedigree was obtained. Significant diagnoses include the following:  Family History  Problem Relation Age of Onset   Breast cancer Mother 62   Breast cancer Maternal Aunt 66   Breast cancer Other 98    mat great aunt (through [facility]) with ovarian cancer    Lauren Bernard's ancestry is of Caucasian descent. There is no known Jewish ancestry or consanguinity.  GENETIC COUNSELING ASSESSMENT: Lauren Bernard is a 48 y.o. female with a family history of cancer suggestive of a hereditary predisposition to cancer. We, therefore, discussed and recommended the following at today's visit.   DISCUSSION: We reviewed the characteristics, features and inheritance patterns of hereditary cancer syndromes. We also discussed genetic testing, including the appropriate family members to test, the process of testing, insurance coverage and turn-around-time for results. We discussed the  implications of a negative, positive and/or variant of uncertain significant result. We recommended Lauren Bernard pursue genetic testing for the BreastNext gene panel.    PLAN: Based on our above recommendation, Lauren Bernard wished to pursue genetic testing and the blood sample was drawn and will be sent to [facility] for analysis of the BreastNext cancer gene panel. Results should be available within approximately 4 weeks time, at which point they will be disclosed by telephone to Ms. Montroy, as will any additional recommendations warranted by these results.   Based on Lauren Bernard family history, we recommended her maternal aunt, who was diagnosed with breast cancer at age 46, have genetic counseling and testing. We discussed that it is always most informative to initiate genetic testing in a family member diagnosed with cancer, as this can sometimes help [place] interpret results in an unaffected family member. Lauren Bernard aunt with breast cancer was present for the visit and is interested in being tested. She will let us know when we can be of assistance in coordinating genetic counseling and/or testing. We encouraged Lauren Bernard to remain in contact with cancer genetics annually so that we can continuously update the family history and inform her of any changes in cancer genetics and testing that may be of benefit for this family. Ms.  Bernard questions were answered to her satisfaction today. Our contact information was provided should additional questions or concerns arise.   Thank you for the referral  and allowing [place] to share in the care of your patient.   The patient was seen for a total of 55 minutes in face-to-face counseling.  This patient was discussed with Fontaine who agrees with the above.    _______________________________________________________________________ For Office Staff:  Number of people involved in session: 4 Was an Intern/ student involved with case: no

## 2013-07-27 DIAGNOSIS — Z1371 Encounter for nonprocreative screening for genetic disease carrier status: Secondary | ICD-10-CM

## 2013-07-27 HISTORY — DX: Encounter for nonprocreative screening for genetic disease carrier status: Z13.71

## 2013-08-03 ENCOUNTER — Telehealth: Payer: Self-pay

## 2013-08-03 NOTE — Telephone Encounter (Signed)
Patient called today to "touch base".  She had genetic counseling on 07/16/13 and had blood test drawn. Was told she should have those results in 2-3 weeks but the genetic counselor felt like based on her fam hx that it would probably be negative.    Patient was trying to figure out about scheduling surgery as she has just not been feeling good and this period started 15 days after LMP.  They had been coming about 4-5 days earlier each time. She will be going on vacation to the beach 3rd week of June.  She asked me to review her ins benefits again. Upon discussing estimated surgery pre payment due she realized that financially she cannot do this now.  She said she will have to postpone it maybe until the end of the summer when she can better afford the costs.  She asked how you would know the cyst was still there if she waited until the end of summer. I told her that you would most likely want her to have u/s and visit prior to scheduling or as pre op.

## 2013-08-06 NOTE — Telephone Encounter (Signed)
Will need to repeat ultrasound ie. mid July

## 2013-08-14 ENCOUNTER — Encounter (HOSPITAL_COMMUNITY): Payer: Self-pay | Admitting: Pharmacist

## 2013-08-14 NOTE — Telephone Encounter (Signed)
Before I called patient back about this she called stating she had decided she wanted to go ahead and schedule the surgery as as possible.  Surgery scheduled for 08/29/13.

## 2013-08-16 ENCOUNTER — Encounter: Payer: Self-pay | Admitting: Genetic Counselor

## 2013-08-16 ENCOUNTER — Telehealth: Payer: Self-pay

## 2013-08-16 DIAGNOSIS — Z803 Family history of malignant neoplasm of breast: Secondary | ICD-10-CM

## 2013-08-16 DIAGNOSIS — Z8041 Family history of malignant neoplasm of ovary: Secondary | ICD-10-CM

## 2013-08-16 NOTE — Telephone Encounter (Signed)
She received her financial letter and where I indicate procedure I had typed "Laparoscopic Ovarian Cystectomy" which is what she is scheduled for.  She said that you had discussed with her that you would go in and look through the scope and if endometriosis you would recommend removing her tubes.  She said she trusts you 100% and if you go in and think it would benefit her to have them removed she wants them removed but if you do not see value in it she does not.    Please advise if I should add this to her surgery that is scheduled.

## 2013-08-16 NOTE — Telephone Encounter (Signed)
We talked about removing her fallopian tubes at the time of surgery regardless.  The reason for the surgery is to remove the ovarian cysts.  I will word the OR consent after our preop appt.

## 2013-08-16 NOTE — Progress Notes (Signed)
HPI:  Ms. Lauren Bernard was previously seen in the [facility] due to a family history of cancer and concerns regarding a hereditary predisposition to cancer. Please refer to our prior cancer genetics clinic note for more information regarding Ms. Lauren Bernard's medical, social and family histories, and our assessment and recommendations, at the time. Ms. Lauren Bernard recent genetic test results were disclosed to her, as were recommendations warranted by these results. These results and recommendations are discussed in more detail below.  GENETIC TEST RESULTS: At the time of Ms. Lauren Bernard's visit, we recommended she pursue genetic testing of the BreastNext gene panel. This test, which included sequencing and deletion/duplication analysis of the genes listed on the test report, was performed at [facility]. Genetic testing was normal, and did not reveal a mutation in these genes. A complete list of all genes tested is located on the test report scanned into EPIC.    We discussed with Ms. Lauren Bernard that since the current genetic testing is not perfect, it is possible there may be a gene mutation in one of these genes that current testing cannot detect, but that chance is small.  We also discussed, that it is possible that another gene that has not yet been discovered, or that we have not yet tested, is responsible for the cancer diagnoses in the family, and it is, therefore, important to remain in touch with cancer genetics in the future so that we can continue to offer Ms. Lauren Bernard the most up to date genetic testing.   CANCER SCREENING RECOMMENDATIONS: This normal result is reassuring and indicates that Ms. Lauren Bernard does not likely have an increased risk of cancer due to a a mutation in one of these genes.  We, therefore, recommended  Ms. Lauren Bernard continue to follow the cancer screening guidelines provided by her primary healthcare providers.   RECOMMENDATIONS FOR FAMILY MEMBERS:  Women in this family might be at some  increased risk of developing cancer, over the general population risk, simply due to the family history of cancer.  We recommended women in this family have a yearly mammogram beginning at age 14, an an annual clinical breast exam, and perform monthly breast self-exams. Women in this family should also have a gynecological exam as recommended by their primary provider. All family members should have a colonoscopy by age 43.  FOLLOW-UP: Lastly, we discussed with Ms. Lauren Bernard that cancer genetics is a rapidly advancing field and it is possible that new genetic tests will be appropriate for her and/or her family members in the future. We encouraged her to remain in contact with cancer genetics on an annual basis so we can update her personal and family histories and let her know of advances in cancer genetics that may benefit this family.   Our contact number was provided. Ms. Lauren Bernard questions were answered to her satisfaction, and she knows she is welcome to call us at anytime with additional questions or concerns. This patient was discussed with Dr. Phineas Real who agrees with the above.   Catherine A. Fine, MS, CGC Certified Genetic Counseor phone: [phone] [email or web address]

## 2013-08-17 NOTE — Telephone Encounter (Signed)
Patient informed. 

## 2013-08-19 ENCOUNTER — Encounter: Payer: Self-pay | Admitting: Gynecology

## 2013-08-27 ENCOUNTER — Ambulatory Visit (INDEPENDENT_AMBULATORY_CARE_PROVIDER_SITE_OTHER): Payer: BC Managed Care – PPO | Admitting: Gynecology

## 2013-08-27 ENCOUNTER — Encounter: Payer: Self-pay | Admitting: Gynecology

## 2013-08-27 DIAGNOSIS — N83209 Unspecified ovarian cyst, unspecified side: Secondary | ICD-10-CM

## 2013-08-27 DIAGNOSIS — N949 Unspecified condition associated with female genital organs and menstrual cycle: Secondary | ICD-10-CM

## 2013-08-27 DIAGNOSIS — R102 Pelvic and perineal pain: Secondary | ICD-10-CM

## 2013-08-27 NOTE — H&P (Signed)
Lauren Bernard 12/04/1965 [id number]   History and Physical  Chief complaint: Pelvic pain, persistent left ovarian cystic mass, desires permanent sterilization  History of present illness: 48 y.o. G2P2002 presents with a history of pelvic discomfort that comes and goes both right and left pelvis with some radiation to her legs since late fall 2014. Had ultrasound 03/2013 which showed a hemorrhagic appearing right ovarian cyst and an avascular left ovarian thickwalled cyst 23 x 22 by 19 mm. Patient was treated with nonsteroidal anti-inflammatories and monitored. The patient has continued to have this ill-defined pelvic discomfort with followup ultrasound 06/2013 showing resolution of the prior right ovarian cyst but persistence/enlargement of the left ovarian thickwalled avascular mass now measuring 36 x 36 x 30 mm. CA 125 was 15. Options for management include continued expectant management versus laparoscopic evaluation rule out endometriosis and removal of this cyst reviewed and the patient elects for laparoscopy and removal of this cyst. She also would like to go sterilization at the same time and we discussed risk reductive salpingectomies and she agrees with this. She does have a strong family history for breast cancer in her mother, maternal aunt and paternal great aunt and recently was screened for BRCA carrier status and was found to be negative.   Past medical history,surgical history, medications, allergies, family history and social history were all reviewed and documented in the EPIC chart. ROS:  Was performed and pertinent positives and negatives are included in the history of present illness.  Exam:  Kim assistant General: well developed, well nourished female, no acute distress HEENT: normal  Lungs: clear to auscultation without wheezing, rales or rhonchi  Cardiac: regular rate without rubs, murmurs or gallops  Abdomen: soft, nontender without masses, guarding, rebound, organomegaly   Pelvic: external bus vagina: normal   Cervix: grossly normal  Uterus: normal size, midline and mobile, nontender  Adnexa: without masses or tenderness     Assessment/Plan:  48 y.o. G2P2002 with persistent pelvic discomfort described as aching to stabbing since fall 2014. Ultrasound shows a persistence/enlargement of a thickwalled avascular hemorrhagic appearing left ovarian cyst suspicious for endometrioma. Patient also desires permanent sterilization. Patient admitted for laparoscopic left ovarian cystectomy, treatment of encountered endometriosis and bilateral salpingectomies. I reviewed the proposed surgery and the expected intraoperative and postoperative courses and recovery period. The risks of infection, prolonged antibiotics, hemorrhage necessitating transfusion and the risks of transfusion including transfusion reaction, hepatitis, HIV, mad cow disease and other unknown entities were reviewed. Incisional complications requiring opening and draining of incisions, closure by secondary intention, long-term issues such as keloiding cosmetics as well as hernia formation were reviewed. The risk of inadvertent injury to internal organs including bowel, bladder, ureters, vessels, nerves either immediately recognized or delay recognized necessitating major reparative surgeries and future reparative surgeries including bowel resection, ostomy formation, bladder repair, ureteral damage repair was all discussed understood and accepted. The absolute and irreversible sterility associated with bilateral salpingectomies was reviewed as well as eliminating the possibility of reanastomoses discussed. There were no guarantees as far as pain relief eradication of endometriosis. She understands that her pain may persist worsen or recur following the procedure. The patient's questions were answered to her satisfaction and she is ready to proceed with surgery.   Note: This document was prepared with digital dictation  and possible smart phrase technology. Any transcriptional errors that result from this process are unintentional.  Timothy P Fontaine MD, 5:09 PM 08/27/2013                  

## 2013-08-27 NOTE — Patient Instructions (Signed)
Followup for surgery as scheduled. 

## 2013-08-27 NOTE — Progress Notes (Signed)
Lauren Bernard 1965/04/13 [id number]   Preoperative consult  Chief complaint: Pelvic pain, persistent left ovarian cystic mass, desires permanent sterilization  History of present illness: 48 y.o. G2P2002 presents with a history of pelvic discomfort that comes and goes both right and left pelvis with some radiation to her legs since late fall 2014. Had ultrasound 03/2013 which showed a hemorrhagic appearing right ovarian cyst and an avascular left ovarian thickwalled cyst 23 x 22 by 19 mm. Patient was treated with nonsteroidal anti-inflammatories and monitored. The patient has continued to have this ill-defined pelvic discomfort with followup ultrasound 06/2013 showing resolution of the prior right ovarian cyst but persistence/enlargement of the left ovarian thickwalled avascular mass now measuring 36 x 36 x 30 mm. CA 125 was 15. Options for management include continued expectant management versus laparoscopic evaluation rule out endometriosis and removal of this cyst reviewed and the patient elects for laparoscopy and removal of this cyst. She also would like to go sterilization at the same time and we discussed risk reductive salpingectomies and she agrees with this. She does have a strong family history for breast cancer in her mother, maternal aunt and paternal great aunt and recently was screened for BRCA carrier status and was found to be negative.   Past medical history,surgical history, medications, allergies, family history and social history were all reviewed and documented in the EPIC chart. ROS:  Was performed and pertinent positives and negatives are included in the history of present illness.  Exam:  Kim assistant General: well developed, well nourished female, no acute distress HEENT: normal  Lungs: clear to auscultation without wheezing, rales or rhonchi  Cardiac: regular rate without rubs, murmurs or gallops  Abdomen: soft, nontender without masses, guarding, rebound, organomegaly   Pelvic: external bus vagina: normal   Cervix: grossly normal  Uterus: normal size, midline and mobile, nontender  Adnexa: without masses or tenderness     Assessment/Plan:  48 y.o. [id number] with persistent pelvic discomfort described as aching to stabbing since fall 2014. Ultrasound shows a persistence/enlargement of a thickwalled avascular hemorrhagic appearing left ovarian cyst suspicious for endometrioma. Patient also desires permanent sterilization. Patient admitted for laparoscopic left ovarian cystectomy, treatment of encountered endometriosis and bilateral salpingectomies. I reviewed the proposed surgery and the expected intraoperative and postoperative courses and recovery period. The risks of infection, prolonged antibiotics, hemorrhage necessitating transfusion and the risks of transfusion including transfusion reaction, hepatitis, HIV, mad cow disease and other unknown entities were reviewed. Incisional complications requiring opening and draining of incisions, closure by secondary intention, long-term issues such as keloiding cosmetics as well as hernia formation were reviewed. The risk of inadvertent injury to internal organs including bowel, bladder, ureters, vessels, nerves either immediately recognized or delay recognized necessitating major reparative surgeries and future reparative surgeries including bowel resection, ostomy formation, bladder repair, ureteral damage repair was all discussed understood and accepted. The absolute and irreversible sterility associated with bilateral salpingectomies was reviewed as well as eliminating the possibility of reanastomoses discussed. There were no guarantees as far as pain relief eradication of endometriosis. She understands that her pain may persist worsen or recur following the procedure. The patient's questions were answered to her satisfaction and she is ready to proceed with surgery.   Note: This document was prepared with digital dictation  and possible smart phrase technology. Any transcriptional errors that result from this process are unintentional.  Lauren Auerbach MD, 4:59 PM 08/27/2013

## 2013-08-29 ENCOUNTER — Ambulatory Visit (HOSPITAL_COMMUNITY)
Admission: RE | Admit: 2013-08-29 | Discharge: 2013-08-29 | Disposition: A | Discharge status: Home / Self Care | Payer: BC Managed Care – PPO | Source: Ambulatory Visit | Attending: Gynecology | Admitting: Gynecology

## 2013-08-29 ENCOUNTER — Encounter (HOSPITAL_COMMUNITY): Payer: BC Managed Care – PPO | Admitting: Anesthesiology

## 2013-08-29 ENCOUNTER — Ambulatory Visit (HOSPITAL_COMMUNITY): Payer: BC Managed Care – PPO | Admitting: Anesthesiology

## 2013-08-29 ENCOUNTER — Encounter (HOSPITAL_COMMUNITY): Payer: Self-pay | Admitting: Anesthesiology

## 2013-08-29 ENCOUNTER — Encounter (HOSPITAL_COMMUNITY)
Admission: RE | Disposition: A | Discharge status: Home / Self Care | Payer: Self-pay | Source: Ambulatory Visit | Attending: Gynecology

## 2013-08-29 DIAGNOSIS — N83209 Unspecified ovarian cyst, unspecified side: Secondary | ICD-10-CM

## 2013-08-29 DIAGNOSIS — N838 Other noninflammatory disorders of ovary, fallopian tube and broad ligament: Secondary | ICD-10-CM | POA: Insufficient documentation

## 2013-08-29 DIAGNOSIS — Z302 Encounter for sterilization: Secondary | ICD-10-CM

## 2013-08-29 DIAGNOSIS — Z803 Family history of malignant neoplasm of breast: Secondary | ICD-10-CM | POA: Insufficient documentation

## 2013-08-29 DIAGNOSIS — N949 Unspecified condition associated with female genital organs and menstrual cycle: Secondary | ICD-10-CM | POA: Insufficient documentation

## 2013-08-29 HISTORY — PX: LAPAROSCOPIC BILATERAL SALPINGECTOMY: SHX5889

## 2013-08-29 HISTORY — PX: LAPAROSCOPIC OVARIAN CYSTECTOMY: SHX6248

## 2013-08-29 LAB — HCG, SERUM, QUALITATIVE: PREG SERUM: NEGATIVE

## 2013-08-29 LAB — CBC
HCT: 37.6 % (ref 36.0–46.0)
Hemoglobin: 12.9 g/dL (ref 12.0–15.0)
MCH: 31.5 pg (ref 26.0–34.0)
MCHC: 34.3 g/dL (ref 30.0–36.0)
MCV: 91.9 fL (ref 78.0–100.0)
PLATELETS: 263 10*3/uL (ref 150–400)
RBC: 4.09 MIL/uL (ref 3.87–5.11)
RDW: 12 % (ref 11.5–15.5)
WBC: 8.2 10*3/uL (ref 4.0–10.5)

## 2013-08-29 SURGERY — EXCISION, CYST, OVARY, LAPAROSCOPIC
Anesthesia: General | Site: Abdomen | Laterality: Right

## 2013-08-29 MED ORDER — METOCLOPRAMIDE HCL 5 MG/ML IJ SOLN: 10.0000 mg | Freq: Once | INTRAMUSCULAR | Status: DC | PRN

## 2013-08-29 MED ORDER — DEXAMETHASONE SODIUM PHOSPHATE 10 MG/ML IJ SOLN
INTRAMUSCULAR | Status: AC
  Filled 2013-08-29: qty 1

## 2013-08-29 MED ORDER — FENTANYL CITRATE 0.05 MG/ML IJ SOLN
INTRAMUSCULAR | Status: AC
  Filled 2013-08-29: qty 5

## 2013-08-29 MED ORDER — ROCURONIUM BROMIDE 100 MG/10ML IV SOLN
INTRAVENOUS | Status: DC | PRN
  Administered 2013-08-29: 35 mg via INTRAVENOUS

## 2013-08-29 MED ORDER — OXYCODONE-ACETAMINOPHEN 5-325 MG PO TABS
1.0000 | ORAL_TABLET | ORAL | Status: DC | PRN
  Administered 2013-08-29: 1 via ORAL

## 2013-08-29 MED ORDER — GLYCOPYRROLATE 0.2 MG/ML IJ SOLN
INTRAMUSCULAR | Status: DC | PRN
  Administered 2013-08-29: 0.6 mg via INTRAVENOUS

## 2013-08-29 MED ORDER — HYDROMORPHONE HCL PF 1 MG/ML IJ SOLN
INTRAMUSCULAR | Status: AC
  Filled 2013-08-29: qty 1

## 2013-08-29 MED ORDER — KETOROLAC TROMETHAMINE 30 MG/ML IJ SOLN
INTRAMUSCULAR | Status: AC
  Filled 2013-08-29: qty 1

## 2013-08-29 MED ORDER — OXYCODONE-ACETAMINOPHEN 5-325 MG PO TABS: 2.0000 | ORAL_TABLET | Freq: Four times a day (QID) | ORAL | Status: DC | PRN

## 2013-08-29 MED ORDER — LACTATED RINGERS IR SOLN
Status: DC | PRN
  Administered 2013-08-29: 3000 mL

## 2013-08-29 MED ORDER — ONDANSETRON HCL 4 MG/2ML IJ SOLN
INTRAMUSCULAR | Status: AC
  Filled 2013-08-29: qty 2

## 2013-08-29 MED ORDER — GLYCOPYRROLATE 0.2 MG/ML IJ SOLN
INTRAMUSCULAR | Status: AC
  Filled 2013-08-29: qty 3

## 2013-08-29 MED ORDER — NEOSTIGMINE METHYLSULFATE 10 MG/10ML IV SOLN
INTRAVENOUS | Status: DC | PRN
  Administered 2013-08-29: 3 mg via INTRAVENOUS

## 2013-08-29 MED ORDER — NEOSTIGMINE METHYLSULFATE 10 MG/10ML IV SOLN
INTRAVENOUS | Status: AC
  Filled 2013-08-29: qty 1

## 2013-08-29 MED ORDER — PROPOFOL 10 MG/ML IV EMUL
INTRAVENOUS | Status: AC
  Filled 2013-08-29: qty 20

## 2013-08-29 MED ORDER — OXYCODONE-ACETAMINOPHEN 5-325 MG PO TABS
ORAL_TABLET | ORAL | Status: AC
  Filled 2013-08-29: qty 1

## 2013-08-29 MED ORDER — LIDOCAINE HCL (CARDIAC) 20 MG/ML IV SOLN
INTRAVENOUS | Status: AC
  Filled 2013-08-29: qty 5

## 2013-08-29 MED ORDER — SCOPOLAMINE 1 MG/3DAYS TD PT72
MEDICATED_PATCH | TRANSDERMAL | Status: AC
  Administered 2013-08-29: 1.5 mg
  Filled 2013-08-29: qty 1

## 2013-08-29 MED ORDER — BUPIVACAINE HCL (PF) 0.25 % IJ SOLN
INTRAMUSCULAR | Status: AC
  Filled 2013-08-29: qty 30

## 2013-08-29 MED ORDER — FENTANYL CITRATE 0.05 MG/ML IJ SOLN
25.0000 ug | INTRAMUSCULAR | Status: DC | PRN
  Administered 2013-08-29: 50 ug via INTRAVENOUS
  Administered 2013-08-29 (×2): 25 ug via INTRAVENOUS

## 2013-08-29 MED ORDER — DEXAMETHASONE SODIUM PHOSPHATE 10 MG/ML IJ SOLN
INTRAMUSCULAR | Status: DC | PRN
  Administered 2013-08-29: 10 mg via INTRAVENOUS

## 2013-08-29 MED ORDER — LIDOCAINE HCL (CARDIAC) 20 MG/ML IV SOLN
INTRAVENOUS | Status: DC | PRN
  Administered 2013-08-29: 30 mg via INTRAVENOUS
  Administered 2013-08-29: 70 mg via INTRAVENOUS

## 2013-08-29 MED ORDER — ONDANSETRON HCL 4 MG/2ML IJ SOLN
INTRAMUSCULAR | Status: DC | PRN
  Administered 2013-08-29: 4 mg via INTRAVENOUS

## 2013-08-29 MED ORDER — ROCURONIUM BROMIDE 100 MG/10ML IV SOLN
INTRAVENOUS | Status: AC
  Filled 2013-08-29: qty 1

## 2013-08-29 MED ORDER — PROPOFOL 10 MG/ML IV BOLUS
INTRAVENOUS | Status: DC | PRN
  Administered 2013-08-29: 180 mg via INTRAVENOUS

## 2013-08-29 MED ORDER — MIDAZOLAM HCL 2 MG/2ML IJ SOLN
INTRAMUSCULAR | Status: AC
  Filled 2013-08-29: qty 2

## 2013-08-29 MED ORDER — MEPERIDINE HCL 25 MG/ML IJ SOLN: 6.2500 mg | INTRAMUSCULAR | Status: DC | PRN

## 2013-08-29 MED ORDER — SCOPOLAMINE 1 MG/3DAYS TD PT72: 1.0000 | MEDICATED_PATCH | TRANSDERMAL | Status: DC

## 2013-08-29 MED ORDER — BUPIVACAINE HCL (PF) 0.25 % IJ SOLN
INTRAMUSCULAR | Status: DC | PRN
  Administered 2013-08-29: 6 mL

## 2013-08-29 MED ORDER — LACTATED RINGERS IV SOLN
INTRAVENOUS | Status: DC
Start: 2013-08-29 — End: 2013-08-29
  Administered 2013-08-29 (×3): via INTRAVENOUS

## 2013-08-29 MED ORDER — MIDAZOLAM HCL 2 MG/2ML IJ SOLN
INTRAMUSCULAR | Status: DC | PRN
  Administered 2013-08-29: 1 mg via INTRAVENOUS

## 2013-08-29 MED ORDER — HEPARIN SODIUM (PORCINE) 5000 UNIT/ML IJ SOLN
INTRAMUSCULAR | Status: DC | PRN
  Administered 2013-08-29: 5000 [IU]

## 2013-08-29 MED ORDER — FENTANYL CITRATE 0.05 MG/ML IJ SOLN
INTRAMUSCULAR | Status: AC
  Administered 2013-08-29: 25 ug via INTRAVENOUS
  Filled 2013-08-29: qty 2

## 2013-08-29 MED ORDER — FENTANYL CITRATE 0.05 MG/ML IJ SOLN
INTRAMUSCULAR | Status: DC | PRN
  Administered 2013-08-29 (×2): 50 ug via INTRAVENOUS

## 2013-08-29 MED ORDER — KETOROLAC TROMETHAMINE 30 MG/ML IJ SOLN
INTRAMUSCULAR | Status: DC | PRN
  Administered 2013-08-29: 30 mg via INTRAVENOUS

## 2013-08-29 MED ORDER — HEPARIN SODIUM (PORCINE) 5000 UNIT/ML IJ SOLN
INTRAMUSCULAR | Status: AC
  Filled 2013-08-29: qty 1

## 2013-08-29 SURGICAL SUPPLY — 26 items
BARRIER ADHS 3X4 INTERCEED (GAUZE/BANDAGES/DRESSINGS) IMPLANT
BLADE 15 SAFETY STRL DISP (BLADE) ×3 IMPLANT
CABLE HIGH FREQUENCY MONO STRZ (ELECTRODE) IMPLANT
CATH ROBINSON RED A/P 16FR (CATHETERS) ×3 IMPLANT
CLOTH BEACON ORANGE TIMEOUT ST (SAFETY) ×3 IMPLANT
DERMABOND ADVANCED (GAUZE/BANDAGES/DRESSINGS) ×1
DERMABOND ADVANCED .7 DNX12 (GAUZE/BANDAGES/DRESSINGS) ×2 IMPLANT
DRSG COVADERM PLUS 2X2 (GAUZE/BANDAGES/DRESSINGS) ×3 IMPLANT
DRSG OPSITE POSTOP 3X4 (GAUZE/BANDAGES/DRESSINGS) ×3 IMPLANT
EVACUATOR SMOKE 8.L (FILTER) ×3 IMPLANT
GLOVE BIO SURGEON STRL SZ7.5 (GLOVE) ×3 IMPLANT
GOWN STRL REUS W/TWL LRG LVL3 (GOWN DISPOSABLE) ×6 IMPLANT
NS IRRIG 1000ML POUR BTL (IV SOLUTION) ×3 IMPLANT
PACK LAPAROSCOPY BASIN (CUSTOM PROCEDURE TRAY) ×3 IMPLANT
POUCH SPECIMEN RETRIEVAL 10MM (ENDOMECHANICALS) IMPLANT
PROTECTOR NERVE ULNAR (MISCELLANEOUS) ×3 IMPLANT
SET IRRIG TUBING LAPAROSCOPIC (IRRIGATION / IRRIGATOR) IMPLANT
SHEARS HARMONIC ACE PLUS 36CM (ENDOMECHANICALS) ×3 IMPLANT
SHEET LAVH (DRAPES) ×3 IMPLANT
SUT PLAIN 4 0 FS 2 27 (SUTURE) ×3 IMPLANT
SUT VICRYL 0 UR6 27IN ABS (SUTURE) ×3 IMPLANT
TOWEL OR 17X24 6PK STRL BLUE (TOWEL DISPOSABLE) ×6 IMPLANT
TROCAR XCEL NON-BLD 11X100MML (ENDOMECHANICALS) ×3 IMPLANT
TROCAR XCEL NON-BLD 5MMX100MML (ENDOMECHANICALS) ×3 IMPLANT
WARMER LAPAROSCOPE (MISCELLANEOUS) ×3 IMPLANT
WATER STERILE IRR 1000ML POUR (IV SOLUTION) ×3 IMPLANT

## 2013-08-29 NOTE — Op Note (Signed)
Lauren Bernard June 06, 1965 [id number]   Post Operative Note   Date of surgery:  08/29/2013  Pre Op Dx:  Pelvic pain, persistent left ovarian cyst, desires permanent sterilization, leiomyoma  Post Op Dx:  Pelvic pain, right ovarian cyst, right paratubal cyst, desires permanent sterilization, leiomyoma  Procedure:  Laparoscopic right ovarian cystectomy, bilateral salpingectomy  Surgeon:  Anastasio Auerbach  Assistant:  Uvaldo Rising  Anesthesia:  General  EBL:  Minimal  Complications:  None  Specimen:  #1 opening cell washings, #2 right ovarian cyst wall #3 right and left fallopian tubes to pathology  Findings: EUA:  External BUS vagina normal. Cervix normal. Uterus normal size midline mobile. Adnexa without masses   Operative:  Anterior cul-de-sac normal. Posterior cul-de-sac normal. Uterus normal size and shape. Small subserosal left posterior fundal myoma. Right and left fallopian tubes normal length caliber and fimbriated ends. Small right paratubal cyst attached to the fimbriated end of the fallopian tube. Left ovary normal. Right ovary with 3-4 cm cyst consistent with corpus luteum. No evidence of pelvic endometriosis or adhesive disease. Upper abdominal exam normal noting liver smooth with no abnormalities. Gallbladder grossly normal. Appendix grossly normal. No upper abdominal adhesions or other pathology.  Procedure:  The patient was taken to the operating room, underwent general anesthesia, was placed in the low dorsal lithotomy position, received an abdominal, or perineal/vaginal preparation with Betadine solution, the bladder emptied with an indwelling Foley catheterization and EUA was performed. A time out was performed by the surgical team. The cervix was visualized with a speculum, anterior lip grasped with a single-tooth tenaculum and a Hulka tenaculum was placed on the cervix. The patient was draped in the usual fashion and a vertical infraumbilical incision was made and using  the 10 mm Optiview type direct entry trocar the abdomen was directly entered under direct visualization without difficulty. Right and left 5 mm suprapubic ports were then placed under direct visualization after transillumination for the vessels without difficulty. Examination of the pelvic organs and upper abdominal exam was carried out with findings noted above. An opening cell washing was then performed and sent to cytology. The right ovary was stabilized and using the harmonic scalpel the cyst was entered and found to be consistent with a corpus luteum cyst. The cyst wall was stripped from the ovarian stroma and sent to pathology. Hemostasis was achieved with bipolar cautery. The left fallopian tube was then dentified and elevated and using the harmonic scalpel the fallopian tube was excised in its entirety along the mesosalpinx including the entire fimbriated end to the level of insertion into the myometrium. A similar procedure was carried out on the other side. The small right paratubal cyst was included in this specimen. The pelvis was then copiously irrigated with adequate hemostasis visualized and the right and left suprapubic ports were removed under direct visualization showing adequate hemostasis. The infraumbilical port was then backed out under direct visualization showing adequate hemostasis and no evidence of hernia formation. All skin incisions were injected using 0.25% Marcaine. The infraumbilical port was closed using 0 Vicryl suture in an interrupted subcutaneous fascial stitch. All skin incisions closed using 4-0 Vicryl in interrupted cuticular stitch. Sterile dressings were applied, the patient received intraoperative Toradol, she was awakened without difficulty and taken to the recovery room in good condition having tolerated the procedure well.   Note: This document was prepared with digital dictation and possible smart phrase technology. Any transcriptional errors that result from this  process are unintentional.  Anastasio Auerbach MD, 2:33 PM 08/29/2013

## 2013-08-29 NOTE — H&P (Signed)
  The patient was examined.  Left side marked. I reviewed the proposed surgery and consent form with the patient.  The dictated history and physical is current and accurate and all questions were answered. The patient is ready to proceed with surgery and has a realistic understanding and expectation for the outcome.   Anastasio Auerbach MD, 12:55 PM 08/29/2013

## 2013-08-29 NOTE — Anesthesia Postprocedure Evaluation (Signed)
Anesthesia Post Note  Patient: Lauren Bernard  Procedure(s) Performed: Procedure(s) (LRB): LAPAROSCOPIC OVARIAN CYSTECTOMY (Right) LAPAROSCOPIC BILATERAL SALPINGECTOMY (Bilateral)  Anesthesia type: General  Patient location: PACU  Post pain: Pain level controlled  Post assessment: Post-op Vital signs reviewed  Last Vitals:  Filed Vitals:   08/29/13 1445  BP: 111/60  Pulse: 59  Temp:   Resp: 12    Post vital signs: Reviewed  Level of consciousness: sedated  Complications: No apparent anesthesia complications

## 2013-08-29 NOTE — Discharge Instructions (Signed)
Postoperative Instructions Laparoscopy  Dr. Phineas Real and the nursing staff have discussed postoperative instructions with you.  If you have any questions please ask them before you leave the hospital, or call Dr Elisabeth Most office at [phone].    May take ibuprofen after 7:53pm as needed for pain/discomfort  Wound care:  Do not get the incision wet for the first 24 hours. The incision should be kept clean and dry.  The Band-Aids or dressings may be removed the day after surgery.  Should the incision become sore, red, and swollen after the first week, check with your doctor.  Activity and limitations:  Do NOT drive or operate any equipment today.  Do NOT lift anything more than 15 pounds for 2-3 weeks after surgery.  Do NOT rest in bed all day.  Walking is encouraged. Walk each day, starting slowly with 5-minute walks 3 or 4 times a day. Slowly increase the length of your walks.  Walk up and down stairs slowly.  Do NOT do strenuous activities, such as golfing, playing tennis, bowling, running, biking, weight lifting, gardening, mowing, or vacuuming for 2-4 weeks. Ask your doctor when it is okay to start.  What to expect after your surgery: You may have a slight burning sensation when you urinate on the first day. You may have a very small amount of blood in the urine. Expect to have a small amount of vaginal discharge/light bleeding for 1-2 weeks. It is not unusual to have abdominal soreness and bruising for up to 2 weeks. You may be tired and need more rest for about 1 week. You may experience shoulder pain for 24-72 hours. Lying flat in bed may relieve it.   We would like to emphasize the following instructions:     Call the office to make your follow-up appointment as recommended by Dr Phineas Real (usually 1-2 weeks).    You were given a prescription, or one was ordered for you at the pharmacy you designated.  Get that prescription filled and take the medication according to  instructions.     Eat a light meal as desired this evening.  Then you may eat a regular diet, but slowly until you start having bowel movements.    Drink plenty of water daily.    Nothing in the vagina (intercourse, douching, objects of any kind) for 2 weeks.  When reinitiating intercourse, if it is uncomfortable, stop and make an appointment with Dr Phineas Real to be evaluated.    No driving for several days until the anesthesia has worn off and you are not having significant pain.  Car rides (short) are ok, as long as you are not having significant pain, but no traveling out of town until your postoperative appointment.    You may shower, but no baths for two weeks.  Walking up and down stairs is ok.  No heavy lifting, prolonged standing, repeated bending or any working out until your first  postoperative appointment.    Rest frequently, listen to your body and do not push yourself and overdo it.    Call if:  o Your pain medication does not seem strong enough. o Worsening pain or abdominal bloating o Persistent nausea or vomiting o Difficulty with urination or bowel movements. o Temperature of 101 degrees or higher. o Heavy vaginal bleeding.  If your period is due, you may use tampons.   o Incisions become red, tender or begin to drain. o You have any questions or concerns  Return to work: This is  dependent on the type of work you do. For the most part you can return to a desk job within a week of surgery. If you are more active at work, please discuss this with your doctor.    Patient Signature________________________________________  Support Person_________________________________________  Nurse Signature_________________________________________

## 2013-08-29 NOTE — Anesthesia Preprocedure Evaluation (Signed)
Anesthesia Evaluation  Patient identified by MRN, date of birth, ID band Patient awake    Reviewed: Allergy & Precautions, H&P , NPO status , Patient's Chart, lab work & pertinent test results  Airway Mallampati: I TM Distance: >3 FB Neck ROM: Full    Dental no notable dental hx. (+) Teeth Intact, Caps   Pulmonary neg pulmonary ROS,  breath sounds clear to auscultation  Pulmonary exam normal       Cardiovascular negative cardio ROS  Rhythm:Regular Rate:Normal     Neuro/Psych negative neurological ROS  negative psych ROS   GI/Hepatic negative GI ROS, Neg liver ROS,   Endo/Other  negative endocrine ROS  Renal/GU negative Renal ROS  negative genitourinary   Musculoskeletal  (+) Arthritis -,   Abdominal   Peds  Hematology negative hematology ROS (+)   Anesthesia Other Findings   Reproductive/Obstetrics Persistent left ovarian cyst Desires sterilization                           Anesthesia Physical Anesthesia Plan  ASA: II  Anesthesia Plan: General   Post-op Pain Management:    Induction: Intravenous  Airway Management Planned: Oral ETT  Additional Equipment:   Intra-op Plan:   Post-operative Plan: Extubation in OR  Informed Consent: I have reviewed the patients History and Physical, chart, labs and discussed the procedure including the risks, benefits and alternatives for the proposed anesthesia with the patient or authorized representative who has indicated his/her understanding and acceptance.   Dental advisory given  Plan Discussed with: CRNA, Anesthesiologist and Surgeon  Anesthesia Plan Comments:         Anesthesia Quick Evaluation

## 2013-08-29 NOTE — Transfer of Care (Signed)
Immediate Anesthesia Transfer of Care Note  Patient: Lauren Bernard  Procedure(s) Performed: Procedure(s) with comments: LAPAROSCOPIC OVARIAN CYSTECTOMY (Right) - Dr. Toney Rakes to assist. LAPAROSCOPIC BILATERAL SALPINGECTOMY (Bilateral)  Patient Location: PACU  Anesthesia Type:General  Level of Consciousness: awake, alert , oriented and patient cooperative  Airway & Oxygen Therapy: Patient Spontanous Breathing and Patient connected to nasal cannula oxygen  Post-op Assessment: Report given to PACU RN and Post -op Vital signs reviewed and stable  Post vital signs: Reviewed and stable  Complications: No apparent anesthesia complications

## 2013-08-30 ENCOUNTER — Encounter (HOSPITAL_COMMUNITY): Payer: Self-pay | Admitting: Gynecology

## 2013-08-30 MED FILL — Heparin Sodium (Porcine) Inj 5000 Unit/ML: INTRAMUSCULAR | Qty: 1 | Status: AC

## 2013-09-04 ENCOUNTER — Encounter: Payer: Self-pay | Admitting: Gynecology

## 2013-09-04 ENCOUNTER — Telehealth: Payer: Self-pay

## 2013-09-04 ENCOUNTER — Ambulatory Visit (INDEPENDENT_AMBULATORY_CARE_PROVIDER_SITE_OTHER): Payer: BC Managed Care – PPO | Admitting: Gynecology

## 2013-09-04 VITALS — Temp 97.6°F

## 2013-09-04 DIAGNOSIS — Z9889 Other specified postprocedural states: Secondary | ICD-10-CM

## 2013-09-04 MED ORDER — IBUPROFEN 800 MG PO TABS: 800.0000 mg | ORAL_TABLET | Freq: Four times a day (QID) | ORAL | Status: AC | PRN

## 2013-09-04 NOTE — Patient Instructions (Signed)
Followup for scheduled appointment this coming Monday. Sooner if any issues.

## 2013-09-04 NOTE — Progress Notes (Signed)
MAISEE VOLLMAN 11/10/1965 [id number]        48 y.o.  [id number] presents postoperative from laparoscopic bilateral salpingectomy and right ovarian cystectomy 08/29/2013. Notes a lump in her right suprapubic port site since surgery that is uncomfortable. Overall though has been doing better. Has stopped her narcotic pain medicine with good pain relief with ibuprofen. Eating, drinking, voiding and having bowel movements. No fevers or chills. Started her menses today.  Past medical history,surgical history, problem list, medications, allergies, family history and social history were all reviewed and documented in the EPIC chart.  Directed ROS with pertinent positives and negatives documented in the history of present illness/assessment and plan.  Exam: Kim assistant General appearance  Normal Abdomen soft with infraumbilical and left ports sites healing nicely and sutures intact. Right port site with palpable lump subcutaneously approximately 2-1/2 cm consistent with a subcutaneous hematoma. Not fluctuant or hernia feeling. Otherwise abdominal exam normal without significant tenderness, masses or guarding. Active bowel sounds throughout. Pelvic external BUS vagina with light menses flow. Cervix normal. Bimanual uterus grossly normal size, mobile nontender. Adnexa without gross masses or tenderness  Assessment/Plan:  48 y.o. G2P2002 post operative 6 days from laparoscopic salpingectomies and right ovarian cystectomy. Right 5 mm port hematoma with tenderness. No evidence of infection, hernia or more significant findings. Recommend heat to the area. Continue ibuprofen 800 mg #30 with 2 refills as needed. Followup at scheduled appointment this coming Monday. Sooner if any issues.   Note: This document was prepared with digital dictation and possible smart phrase technology. Any transcriptional errors that result from this process are unintentional.   Anastasio Auerbach MD, 11:23 AM 09/04/2013

## 2013-09-04 NOTE — Telephone Encounter (Signed)
Patient called c/0 incision on R side causing a lot of discomfort.  She said she thought she would be able to return to work yesterday but "just hit a wall" and did not feel as good as had been feeling. The right side incision looks fine but she said there appears to be internal swelling or a know inside at the top.  Rec OV to assess.  Appt made.

## 2013-09-10 ENCOUNTER — Ambulatory Visit: Payer: BC Managed Care – PPO | Admitting: Gynecology

## 2013-10-02 ENCOUNTER — Ambulatory Visit: Payer: BC Managed Care – PPO | Admitting: Gynecology

## 2013-10-16 ENCOUNTER — Encounter: Payer: Self-pay | Admitting: Gynecology

## 2013-10-16 ENCOUNTER — Ambulatory Visit (INDEPENDENT_AMBULATORY_CARE_PROVIDER_SITE_OTHER): Payer: BC Managed Care – PPO | Admitting: Gynecology

## 2013-10-16 DIAGNOSIS — Z9889 Other specified postprocedural states: Secondary | ICD-10-CM

## 2013-10-16 NOTE — Progress Notes (Signed)
[name] BELOW Jun 03, 1965 [id number]        48 y.o.  [name] presents for final postoperative visit. Status post laparoscopic bilateral salpingectomy and right ovarian cystectomy 08/29/2013. Postoperatively she had a small hematoma right 5 millimeter suprapubic port site.  This has resolved and she has done well. Had been away on vacation and now returns for her final postoperative check up.  Past medical history,surgical history, problem list, medications, allergies, family history and social history were all reviewed and documented in the EPIC chart.  Directed ROS with pertinent positives and negatives documented in the history of present illness/assessment and plan.  Exam: Lauren Bernard assistant General appearance:  Normal Abdomen: Soft nontender without masses guarding rebound. Incision sites all healed nicely. Prior hematoma right suprapubic port site resolved. Pelvic: External BUS vagina normal. Cervix normal. Uterus normal sized mobile nontender. Adnexa without masses or tenderness  Assessment/Plan:  48 y.o. G2P2002 normal postoperative visit. Pathology showed benign functional cyst right ovary and benign fallopian tubes. Patient will resume all normal activities and followup in February 2016 when she's due for her annual exam, sooner if any issues.   Note: This document was prepared with digital dictation and possible smart phrase technology. Any transcriptional errors that result from this process are unintentional.   Lauren Auerbach MD, 10:16 AM 10/16/2013

## 2013-10-16 NOTE — Patient Instructions (Signed)
Followup in February 2016 when you're due for your annual exam. Sooner if any issues.

## 2013-12-10 ENCOUNTER — Other Ambulatory Visit: Payer: Self-pay

## 2013-12-10 DIAGNOSIS — Z1231 Encounter for screening mammogram for malignant neoplasm of breast: Secondary | ICD-10-CM

## 2013-12-19 ENCOUNTER — Ambulatory Visit
Admission: RE | Admit: 2013-12-19 | Discharge: 2013-12-19 | Disposition: A | Discharge status: Home / Self Care | Payer: BC Managed Care – PPO | Source: Ambulatory Visit

## 2013-12-19 DIAGNOSIS — Z1231 Encounter for screening mammogram for malignant neoplasm of breast: Secondary | ICD-10-CM

## 2014-01-28 ENCOUNTER — Encounter: Payer: Self-pay | Admitting: Gynecology

## 2015-06-18 ENCOUNTER — Encounter: Payer: Self-pay | Admitting: Emergency Medicine

## 2015-06-18 ENCOUNTER — Ambulatory Visit
Admission: EM | Admit: 2015-06-18 | Discharge: 2015-06-18 | Disposition: A | Discharge status: Home / Self Care | Payer: BLUE CROSS/BLUE SHIELD | Attending: Emergency Medicine | Admitting: Emergency Medicine

## 2015-06-18 DIAGNOSIS — B349 Viral infection, unspecified: Secondary | ICD-10-CM

## 2015-06-18 LAB — RAPID INFLUENZA A&B ANTIGENS
Influenza A (ARMC): NEGATIVE
Influenza B (ARMC): NEGATIVE

## 2015-06-18 MED ORDER — BENZONATATE 100 MG PO CAPS: 100.0000 mg | ORAL_CAPSULE | Freq: Three times a day (TID) | ORAL | Status: DC | PRN

## 2015-06-18 MED ORDER — HYDROCOD POLST-CPM POLST ER 10-8 MG/5ML PO SUER: 5.0000 mL | Freq: Every evening | ORAL | Status: DC | PRN

## 2015-06-18 MED ORDER — OSELTAMIVIR PHOSPHATE 75 MG PO CAPS: 75.0000 mg | ORAL_CAPSULE | Freq: Two times a day (BID) | ORAL | Status: DC

## 2015-06-18 NOTE — Discharge Instructions (Signed)
Take medication as prescribed. Rest. Drink plenty of fluids.  ° °Follow up with your primary care physician this week as needed. Return to Urgent care for new or worsening concerns.  ° ° °Viral Infections °A viral infection can be caused by different types of viruses. Most viral infections are not serious and resolve on their own. However, some infections may cause severe symptoms and may lead to further complications. °SYMPTOMS °Viruses can frequently cause: °· Minor sore throat. °· Aches and pains. °· Headaches. °· Runny nose. °· Different types of rashes. °· Watery eyes. °· Tiredness. °· Cough. °· Loss of appetite. °· Gastrointestinal infections, resulting in nausea, vomiting, and diarrhea. °These symptoms do not respond to antibiotics because the infection is not caused by bacteria. However, you might catch a bacterial infection following the viral infection. This is sometimes called a "superinfection." Symptoms of such a bacterial infection may include: °· Worsening sore throat with pus and difficulty swallowing. °· Swollen neck glands. °· Chills and a high or persistent fever. °· Severe headache. °· Tenderness over the sinuses. °· Persistent overall ill feeling (malaise), muscle aches, and tiredness (fatigue). °· Persistent cough. °· Yellow, green, or brown mucus production with coughing. °HOME CARE INSTRUCTIONS  °· Only take over-the-counter or prescription medicines for pain, discomfort, diarrhea, or fever as directed by your caregiver. °· Drink enough water and fluids to keep your urine clear or pale yellow. Sports drinks can provide valuable electrolytes, sugars, and hydration. °· Get plenty of rest and maintain proper nutrition. Soups and broths with crackers or rice are fine. °SEEK IMMEDIATE MEDICAL CARE IF:  °· You have severe headaches, shortness of breath, chest pain, neck pain, or an unusual rash. °· You have uncontrolled vomiting, diarrhea, or you are unable to keep down fluids. °· You or your child  has an oral temperature above 102° F (38.9° C), not controlled by medicine. °· Your baby is older than 3 months with a rectal temperature of 102° F (38.9° C) or higher. °· Your baby is 3 months old or younger with a rectal temperature of 100.4° F (38° C) or higher. °MAKE SURE YOU:  °· Understand these instructions. °· Will watch your condition. °· Will get help right away if you are not doing well or get worse. °  °This information is not intended to replace advice given to you by your health care provider. Make sure you discuss any questions you have with your health care provider. °  °Document Released: 12/23/2004 Document Revised: 06/07/2011 Document Reviewed: 08/21/2014 °[employer] Interactive Patient Education ©2016 [employer] ° °

## 2015-06-18 NOTE — ED Provider Notes (Signed)
[facility]  ____________________________________________  Time seen: Approximately 1:10 PM  I have reviewed the triage vital signs and the nursing notes.   HISTORY  Chief Complaint Headache; Generalized Body Aches; and Cough   HPI Lauren Bernard is a 50 y.o. female presents for complaints of cough, body aches, chills, mild runny nose, mild nasal congestion since yesterday. States did not measure temperature but felt like she had a fever. Reports continues to eat and drink well. Patient reports that her son had the flu this past weekend and states that her husband had the flu earlier this week just prior to her becoming sick. Patient reports that she has been alternating Tylenol and ibuprofen for the body aches.  Denies chest pain, shortness of breath, abdominal pain, dysuria, neck pain, back pain, rash, dizziness, weakness, vision changes.   Patient's last menstrual period was 05/28/2015 (exact date). Denies chance of pregnancy.     Past Medical History  Diagnosis Date  . Arthritis     Toe  . BRCA negative 07/2013    Patient Active Problem List   Diagnosis Date Noted  . Family history of malignant neoplasm of breast 07/16/2013  . Family history of malignant neoplasm of ovary 07/16/2013  . Arthritis   . IRRITABLE BOWEL SYNDROME 11/14/2008  . RASH-NONVESICULAR 11/14/2008    Past Surgical History  Procedure Laterality Date  . Laparoscopic ovarian cystectomy Right 08/29/2013    Procedure: LAPAROSCOPIC OVARIAN CYSTECTOMY;  Surgeon: Anastasio Auerbach, MD;  Location: [facility];  Service: Gynecology;  Laterality: Right;  Dr. Toney Rakes to assist.  . Laparoscopic bilateral salpingectomy Bilateral 08/29/2013    Procedure: LAPAROSCOPIC BILATERAL SALPINGECTOMY;  Surgeon: Anastasio Auerbach, MD;  Location: [facility];  Service: Gynecology;  Laterality: Bilateral;    Current Outpatient Rx  Name  Route  Sig  Dispense  Refill  .           Marland Kitchen Multiple Vitamin (MULTIVITAMIN) tablet  Oral   Take 1 tablet by mouth daily.         .             Allergies Amoxicillin  Family History  Problem Relation Age of Onset  . Breast cancer Mother 46  . Breast cancer Maternal Aunt 57  . Breast cancer Other 26    mat great aunt (through [facility]) with ovarian cancer    Social History Social History  Substance Use Topics  . Smoking status: Never Smoker   . Smokeless tobacco: None  . Alcohol Use: Yes     Comment: Rare    Review of Systems Constitutional: Subjective fevers.  Eyes: No visual changes. ENT: No sore throat. Positive runny nose, nasal congestion and cough. Cardiovascular: Denies chest pain. Respiratory: Denies shortness of breath. Gastrointestinal: No abdominal pain.  No nausea, no vomiting.  No diarrhea.  No constipation. Genitourinary: Negative for dysuria. Musculoskeletal: Negative for back pain. Skin: Negative for rash. Neurological: Negative for headaches, focal weakness or numbness.  10-point ROS otherwise negative.  ____________________________________________   PHYSICAL EXAM:  VITAL SIGNS: ED Triage Vitals  Enc Vitals Group     BP 06/18/15 1233 103/86 mmHg     Pulse Rate 06/18/15 1233 90     Resp 06/18/15 1233 16     Temp 06/18/15 1233 99.1 F (37.3 C)     Temp Source 06/18/15 1233 Oral     SpO2 06/18/15 1233 100 %     Weight 06/18/15 1233 140 lb (63.504 kg)  Height 06/18/15 1233 5' 3.5" (1.613 m)     Head Cir --      Peak Flow --      Pain Score 06/18/15 1236 4     Pain Loc --      Pain Edu? --      Excl. in [place]? --   Constitutional: Alert and oriented. Well appearing and in no acute distress. Eyes: Conjunctivae are normal. PERRL. EOMI. Head: Atraumatic. No sinus tenderness to palpation. No swelling. No erythema.  Ears: no erythema, normal TMs bilaterally.   Nose:Nasal congestion with clear rhinorrhea  Mouth/Throat: Mucous membranes are moist. No pharyngeal erythema. No tonsillar swelling or exudate.  Neck: No stridor.  No  cervical spine tenderness to palpation. Hematological/Lymphatic/Immunilogical: No cervical lymphadenopathy. Cardiovascular: Normal rate, regular rhythm. Grossly normal heart sounds.  Good peripheral circulation. Respiratory: Normal respiratory effort.  No retractions. Lungs CTAB.No wheezes, rales or rhonchi. Good air movement.  Gastrointestinal: Soft and nontender. Normal Bowel sounds. No CVA tenderness. Musculoskeletal: No lower or upper extremity tenderness nor edema. No cervical, thoracic or lumbar tenderness to palpation. Neurologic:  Normal speech and language. No gross focal neurologic deficits are appreciated. No gait instability. Skin:  Skin is warm, dry and intact. No rash noted. Psychiatric: Mood and affect are normal. Speech and behavior are normal.  ____________________________________________   LABS (all labs ordered are listed, but only abnormal results are displayed)  Labs Reviewed  RAPID INFLUENZA A&B ANTIGENS ([facility])   ____________________________________________   INITIAL IMPRESSION / ASSESSMENT AND PLAN / ED COURSE  Pertinent labs & imaging results that were available during my care of the patient were reviewed by me and considered in my medical decision making (see chart for details).  Very well-appearing patient. No acute distress. Presents with a complaint of one day of cough congestion body aches and subjective fevers. Patient recently exposed to influenza from baseline as well as has been just prior to symptom onset. Lungs clear throughout. Abdomen soft and nontender. Suspect viral illness such as influenza. Influenza test negative. However discussed with patient suspect influenza. Discussed treatment of oral Tamiflu. Patient request for prescription for Tamiflu. We'll also treat with when necessary Tessalon Perles and when necessary Tussionex at night.Discussed indication, risks and benefits of medications with patient. Encourage rest, fluids, over-the-counter  Tylenol or ibuprofen as needed.  Discussed follow up with Primary care physician this week. Discussed follow up and return parameters including no resolution or any worsening concerns. Patient verbalized understanding and agreed to plan.   ____________________________________________   FINAL CLINICAL IMPRESSION(S) / ED DIAGNOSES  Final diagnoses:  Viral illness      Note: This dictation was prepared with Dragon dictation along with smaller phrase technology. Any transcriptional errors that result from this process are unintentional.    Marylene Land, NP 06/18/15 1507

## 2015-06-18 NOTE — ED Notes (Signed)
Patient c/o HAs, cough, and bodyaches that started yesterday.

## 2015-08-11 ENCOUNTER — Other Ambulatory Visit: Payer: Self-pay

## 2015-08-11 DIAGNOSIS — Z803 Family history of malignant neoplasm of breast: Secondary | ICD-10-CM

## 2015-08-11 DIAGNOSIS — Z1231 Encounter for screening mammogram for malignant neoplasm of breast: Secondary | ICD-10-CM

## 2015-08-12 ENCOUNTER — Ambulatory Visit (INDEPENDENT_AMBULATORY_CARE_PROVIDER_SITE_OTHER): Payer: BLUE CROSS/BLUE SHIELD | Admitting: Gynecology

## 2015-08-12 ENCOUNTER — Encounter: Payer: Self-pay | Admitting: Gynecology

## 2015-08-12 VITALS — BP 114/70

## 2015-08-12 DIAGNOSIS — N898 Other specified noninflammatory disorders of vagina: Secondary | ICD-10-CM

## 2015-08-12 DIAGNOSIS — R1032 Left lower quadrant pain: Secondary | ICD-10-CM | POA: Diagnosis not present

## 2015-08-12 DIAGNOSIS — R350 Frequency of micturition: Secondary | ICD-10-CM | POA: Diagnosis not present

## 2015-08-12 DIAGNOSIS — R8290 Unspecified abnormal findings in urine: Secondary | ICD-10-CM

## 2015-08-12 LAB — WET PREP FOR TRICH, YEAST, CLUE
CLUE CELLS WET PREP: NONE SEEN
Trich, Wet Prep: NONE SEEN
YEAST WET PREP: NONE SEEN

## 2015-08-12 LAB — URINALYSIS W MICROSCOPIC + REFLEX CULTURE
BACTERIA UA: NONE SEEN [HPF]
Bilirubin Urine: NEGATIVE
CASTS: NONE SEEN [LPF]
CRYSTALS: NONE SEEN [HPF]
Glucose, UA: NEGATIVE
HGB URINE DIPSTICK: NEGATIVE
Ketones, ur: NEGATIVE
Leukocytes, UA: NEGATIVE
Nitrite: NEGATIVE
Protein, ur: NEGATIVE
RBC / HPF: NONE SEEN RBC/HPF (ref <=2)
Specific Gravity, Urine: 1.01 (ref 1.001–1.035)
WBC, UA: NONE SEEN WBC/HPF (ref <=5)
YEAST: NONE SEEN [HPF]
pH: 7 (ref 5.0–8.0)

## 2015-08-12 MED ORDER — NITROFURANTOIN MONOHYD MACRO 100 MG PO CAPS: 100.0000 mg | ORAL_CAPSULE | Freq: Two times a day (BID) | ORAL | Status: DC

## 2015-08-12 MED ORDER — FLUCONAZOLE 150 MG PO TABS: 150.0000 mg | ORAL_TABLET | Freq: Once | ORAL | Status: DC

## 2015-08-12 NOTE — Progress Notes (Signed)
    VERBLE PEEKS 10-08-1965 [id number]        50 y.o.  [id number] Presents with several issues. Over the last month or so patient has noticed lower abdominal discomfort primarily on the left that comes and goes reminiscent when she had an ovarian cyst in the past. Not extremely painful but nagging in nature. Over the last week or 2 increasing frequency, low back pain and odor to the urine. No real dysuria fever chills nausea vomiting diarrhea constipation. There is some vaginal irritation but no discharge or vaginal odor.  Past medical history,surgical history, problem list, medications, allergies, family history and social history were all reviewed and documented in the EPIC chart.  Directed ROS with pertinent positives and negatives documented in the history of present illness/assessment and plan.  Exam: Caryn Bee assistant Filed Vitals:   08/12/15 1550  BP: 114/70   General appearance:  Normal Spine straight without CVA tenderness Abdomen soft nontender without masses guarding rebound Pelvic external BUS vagina with slight white discharge. Cervix normal. Uterus normal size midline mobile nontender. Adnexa without masses or tenderness.  Assessment/Plan:  50 y.o. [id number] with history very suggestive of UTI although urinalysis is negative. Will check culture. Will cover with Macrobid 100 mg twice a day 7 days. Wet prep was negative but given the irritation history white discharge and antibiotic treatment will cover with Diflucan 150 mg 1 dose. With history of ovarian cyst and her discomfort reminiscent of this we'll go ahead and check baseline ultrasound and she will schedule and follow up for this.    Anastasio Auerbach MD, 4:16 PM 08/12/2015

## 2015-08-12 NOTE — Addendum Note (Signed)
Addended by: Nelva Nay on: 08/12/2015 04:35 PM   Modules accepted: Orders

## 2015-08-12 NOTE — Patient Instructions (Addendum)
Follow up for ultrasound as scheduled.  Takes antibiotic twice daily for 7 days  Take the Diflucan pill once

## 2015-08-14 ENCOUNTER — Other Ambulatory Visit: Payer: Self-pay | Admitting: Gynecology

## 2015-08-14 ENCOUNTER — Ambulatory Visit (INDEPENDENT_AMBULATORY_CARE_PROVIDER_SITE_OTHER): Payer: BLUE CROSS/BLUE SHIELD

## 2015-08-14 ENCOUNTER — Ambulatory Visit (INDEPENDENT_AMBULATORY_CARE_PROVIDER_SITE_OTHER): Payer: BLUE CROSS/BLUE SHIELD | Admitting: Gynecology

## 2015-08-14 ENCOUNTER — Encounter: Payer: Self-pay | Admitting: Gynecology

## 2015-08-14 VITALS — BP 120/76

## 2015-08-14 DIAGNOSIS — R1030 Lower abdominal pain, unspecified: Secondary | ICD-10-CM | POA: Diagnosis not present

## 2015-08-14 DIAGNOSIS — D251 Intramural leiomyoma of uterus: Secondary | ICD-10-CM | POA: Diagnosis not present

## 2015-08-14 DIAGNOSIS — R1032 Left lower quadrant pain: Secondary | ICD-10-CM

## 2015-08-14 LAB — URINE CULTURE
Colony Count: NO GROWTH
Organism ID, Bacteria: NO GROWTH

## 2015-08-14 NOTE — Progress Notes (Addendum)
    Lauren Bernard [birth year] [id number]        50 y.o.  G2P2002 presents for ultrasound. Recent evaluation for left lower quadrant pain with negative exam. Does have history of prior ovarian cysts. Was started on Macrobid for possible UTI. Does feel like she is feeling a little bit better. Started bleeding yesterday which is early for her menses and her low back pain she was having when away.  Past medical history,surgical history, problem list, medications, allergies, family history and social history were all reviewed and documented in the EPIC chart.  Directed ROS with pertinent positives and negatives documented in the history of present illness/assessment and plan.  Exam: Filed Vitals:   08/14/15 1531  BP: 120/76   General appearance:  Normal  Ultrasound shows uterus normal size and echotexture. Several small myomas noted 20 mm, 14 mm, 13 mm. Endometrial echo 5.5 mm. Right and left ovaries normal. Cul-de-sac negative.  Assessment/Plan:  50 y.o. [id number] with history as above. Will complete her course of antibiotics for presumed UTI.  Ultrasound is normal. Assuming she continues to improve and will follow up in July when she is due for her annual exam. If not then she'll follow up sooner for reevaluation.    Anastasio Auerbach MD, 3:43 PM 08/14/2015

## 2015-08-14 NOTE — Patient Instructions (Signed)
Follow up in July for your annual exam as scheduled.

## 2015-08-29 ENCOUNTER — Ambulatory Visit: Payer: BLUE CROSS/BLUE SHIELD

## 2015-09-25 ENCOUNTER — Ambulatory Visit
Admission: RE | Admit: 2015-09-25 | Discharge: 2015-09-25 | Disposition: A | Discharge status: Home / Self Care | Payer: BLUE CROSS/BLUE SHIELD | Source: Ambulatory Visit

## 2015-09-25 DIAGNOSIS — Z803 Family history of malignant neoplasm of breast: Secondary | ICD-10-CM

## 2015-09-25 DIAGNOSIS — Z1231 Encounter for screening mammogram for malignant neoplasm of breast: Secondary | ICD-10-CM

## 2015-10-01 ENCOUNTER — Encounter: Payer: Self-pay | Admitting: Gynecology

## 2015-12-11 ENCOUNTER — Encounter: Payer: Self-pay | Admitting: Gynecology

## 2015-12-11 ENCOUNTER — Ambulatory Visit (INDEPENDENT_AMBULATORY_CARE_PROVIDER_SITE_OTHER): Payer: BLUE CROSS/BLUE SHIELD | Admitting: Gynecology

## 2015-12-11 VITALS — BP 118/74 | Ht 63.0 in | Wt 141.0 lb

## 2015-12-11 DIAGNOSIS — Z01419 Encounter for gynecological examination (general) (routine) without abnormal findings: Secondary | ICD-10-CM | POA: Diagnosis not present

## 2015-12-11 DIAGNOSIS — Z13220 Encounter for screening for lipoid disorders: Secondary | ICD-10-CM | POA: Diagnosis not present

## 2015-12-11 LAB — COMPREHENSIVE METABOLIC PANEL
ALT: 13 U/L (ref 6–29)
AST: 15 U/L (ref 10–35)
Albumin: 4 g/dL (ref 3.6–5.1)
Alkaline Phosphatase: 62 U/L (ref 33–115)
BUN: 10 mg/dL (ref 7–25)
CALCIUM: 9.1 mg/dL (ref 8.6–10.2)
CO2: 26 mmol/L (ref 20–31)
Chloride: 102 mmol/L (ref 98–110)
Creat: 0.85 mg/dL (ref 0.50–1.10)
GLUCOSE: 87 mg/dL (ref 65–99)
POTASSIUM: 4.3 mmol/L (ref 3.5–5.3)
Sodium: 137 mmol/L (ref 135–146)
TOTAL PROTEIN: 6.2 g/dL (ref 6.1–8.1)
Total Bilirubin: 0.5 mg/dL (ref 0.2–1.2)

## 2015-12-11 LAB — LIPID PANEL
CHOL/HDL RATIO: 3.5 ratio (ref <=5.0)
CHOLESTEROL: 197 mg/dL (ref 125–200)
HDL: 56 mg/dL (ref >=46)
LDL Cholesterol: 119 mg/dL (ref <130)
Triglycerides: 111 mg/dL (ref <150)
VLDL: 22 mg/dL (ref <30)

## 2015-12-11 LAB — CBC WITH DIFFERENTIAL/PLATELET
Basophils Absolute: 60 cells/uL (ref 0–200)
Basophils Relative: 1 %
EOS PCT: 2 %
Eosinophils Absolute: 120 cells/uL (ref 15–500)
HEMATOCRIT: 35.9 % (ref 35.0–45.0)
HEMOGLOBIN: 12.1 g/dL (ref 11.7–15.5)
Lymphocytes Relative: 27 %
Lymphs Abs: 1620 cells/uL (ref 850–3900)
MCH: 31 pg (ref 27.0–33.0)
MCHC: 33.7 g/dL (ref 32.0–36.0)
MCV: 92.1 fL (ref 80.0–100.0)
MONO ABS: 360 {cells}/uL (ref 200–950)
MPV: 9.6 fL (ref 7.5–12.5)
Monocytes Relative: 6 %
NEUTROS ABS: 3840 {cells}/uL (ref 1500–7800)
Neutrophils Relative %: 64 %
Platelets: 320 10*3/uL (ref 140–400)
RBC: 3.9 MIL/uL (ref 3.80–5.10)
RDW: 12.9 % (ref 11.0–15.0)
WBC: 6 10*3/uL (ref 3.8–10.8)

## 2015-12-11 NOTE — Patient Instructions (Signed)

## 2015-12-11 NOTE — Progress Notes (Signed)
    Lauren Bernard 1966-01-02 [id number]        50 y.o.  G2P2002  for annual exam.  Doing well without complaints  Past medical history,surgical history, problem list, medications, allergies, family history and social history were all reviewed and documented as reviewed in the EPIC chart.  ROS:  Performed with pertinent positives and negatives included in the history, assessment and plan.   Additional significant findings :  None   Exam: Caryn Bee assistant Vitals:   12/11/15 0845  BP: 118/74  Weight: 141 lb (64 kg)  Height: 5\' 3"  (1.6 m)   Body mass index is 24.98 kg/m.  General appearance:  Normal affect, orientation and appearance. Skin: Grossly normal HEENT: Without gross lesions.  No cervical or supraclavicular adenopathy. Thyroid normal.  Lungs:  Clear without wheezing, rales or rhonchi Cardiac: RR, without RMG Abdominal:  Soft, nontender, without masses, guarding, rebound, organomegaly or hernia Breasts:  Examined lying and sitting without masses, retractions, discharge or axillary adenopathy. Pelvic:  Ext/BUS/Vagina normal  Cervix normal  Uterus anteverted, normal size, shape and contour, midline and mobile nontender   Adnexa without masses or tenderness    Anus and perineum normal   Rectovaginal normal sphincter tone without palpated masses or tenderness.    Assessment/Plan:  50 y.o. G45P2002 female for annual exam with regular menses, bilateral salpingectomies.   1. Pap smear/HPV 2014. No Pap smear done today. No history of significant abnormal Pap smears. Plan repeat Pap smear approaching 5 year interval. 2. Mammography 08/2015. Mother and maternal aunt with breast cancer. The patient was genetically tested and negative. Continue with annual mammography when due. SBE monthly reviewed. 3. Health maintenance. Baseline CBC, lipid profile, CMP, urinalysis ordered. Follow up 1 year, sooner as needed.   Anastasio Auerbach MD, 9:08 AM 12/11/2015

## 2015-12-12 LAB — URINALYSIS W MICROSCOPIC + REFLEX CULTURE
BACTERIA UA: NONE SEEN [HPF]
Bilirubin Urine: NEGATIVE
CRYSTALS: NONE SEEN [HPF]
Casts: NONE SEEN [LPF]
Glucose, UA: NEGATIVE
Hgb urine dipstick: NEGATIVE
KETONES UR: NEGATIVE
Leukocytes, UA: NEGATIVE
Nitrite: NEGATIVE
PROTEIN: NEGATIVE
RBC / HPF: NONE SEEN RBC/HPF (ref <=2)
SPECIFIC GRAVITY, URINE: 1.008 (ref 1.001–1.035)
SQUAMOUS EPITHELIAL / LPF: NONE SEEN [HPF] (ref <=5)
WBC UA: NONE SEEN WBC/HPF (ref <=5)
Yeast: NONE SEEN [HPF]
pH: 5.5 (ref 5.0–8.0)

## 2016-01-29 DIAGNOSIS — J019 Acute sinusitis, unspecified: Secondary | ICD-10-CM | POA: Diagnosis not present

## 2016-03-08 DIAGNOSIS — M67331 Transient synovitis, right wrist: Secondary | ICD-10-CM | POA: Diagnosis not present

## 2016-04-22 DIAGNOSIS — G902 Horner's syndrome: Secondary | ICD-10-CM | POA: Diagnosis not present

## 2016-04-22 DIAGNOSIS — H5702 Anisocoria: Secondary | ICD-10-CM | POA: Diagnosis not present

## 2016-04-22 DIAGNOSIS — H02432 Paralytic ptosis of left eyelid: Secondary | ICD-10-CM | POA: Diagnosis not present

## 2016-05-03 DIAGNOSIS — G902 Horner's syndrome: Secondary | ICD-10-CM | POA: Diagnosis not present

## 2016-05-14 DIAGNOSIS — M5021 Other cervical disc displacement,  high cervical region: Secondary | ICD-10-CM | POA: Diagnosis not present

## 2016-05-14 DIAGNOSIS — M47812 Spondylosis without myelopathy or radiculopathy, cervical region: Secondary | ICD-10-CM | POA: Diagnosis not present

## 2016-05-14 DIAGNOSIS — J322 Chronic ethmoidal sinusitis: Secondary | ICD-10-CM | POA: Diagnosis not present

## 2016-05-14 DIAGNOSIS — G902 Horner's syndrome: Secondary | ICD-10-CM | POA: Diagnosis not present

## 2016-06-18 DIAGNOSIS — M542 Cervicalgia: Secondary | ICD-10-CM | POA: Diagnosis not present

## 2016-06-18 DIAGNOSIS — M47812 Spondylosis without myelopathy or radiculopathy, cervical region: Secondary | ICD-10-CM | POA: Diagnosis not present

## 2016-06-18 DIAGNOSIS — M5020 Other cervical disc displacement, unspecified cervical region: Secondary | ICD-10-CM | POA: Diagnosis not present

## 2016-07-20 DIAGNOSIS — M5021 Other cervical disc displacement,  high cervical region: Secondary | ICD-10-CM | POA: Diagnosis not present

## 2016-07-20 DIAGNOSIS — M2578 Osteophyte, vertebrae: Secondary | ICD-10-CM | POA: Diagnosis not present

## 2016-07-20 DIAGNOSIS — M5020 Other cervical disc displacement, unspecified cervical region: Secondary | ICD-10-CM | POA: Diagnosis not present

## 2016-07-20 DIAGNOSIS — M4312 Spondylolisthesis, cervical region: Secondary | ICD-10-CM | POA: Diagnosis not present

## 2016-07-20 DIAGNOSIS — M47813 Spondylosis without myelopathy or radiculopathy, cervicothoracic region: Secondary | ICD-10-CM | POA: Diagnosis not present

## 2016-07-20 DIAGNOSIS — M9951 Intervertebral disc stenosis of neural canal of cervical region: Secondary | ICD-10-CM | POA: Diagnosis not present

## 2016-07-20 DIAGNOSIS — M5001 Cervical disc disorder with myelopathy,  high cervical region: Secondary | ICD-10-CM | POA: Diagnosis not present

## 2016-07-20 DIAGNOSIS — M19071 Primary osteoarthritis, right ankle and foot: Secondary | ICD-10-CM | POA: Diagnosis not present

## 2016-07-20 DIAGNOSIS — Z880 Allergy status to penicillin: Secondary | ICD-10-CM | POA: Diagnosis not present

## 2016-07-20 DIAGNOSIS — M4802 Spinal stenosis, cervical region: Secondary | ICD-10-CM | POA: Diagnosis not present

## 2016-07-20 DIAGNOSIS — M5031 Other cervical disc degeneration,  high cervical region: Secondary | ICD-10-CM | POA: Diagnosis not present

## 2016-07-20 DIAGNOSIS — G902 Horner's syndrome: Secondary | ICD-10-CM | POA: Diagnosis not present

## 2016-07-20 DIAGNOSIS — M545 Low back pain: Secondary | ICD-10-CM | POA: Diagnosis not present

## 2016-07-20 DIAGNOSIS — G9520 Unspecified cord compression: Secondary | ICD-10-CM | POA: Diagnosis not present

## 2016-07-21 DIAGNOSIS — M545 Low back pain: Secondary | ICD-10-CM | POA: Diagnosis not present

## 2016-07-21 DIAGNOSIS — M19071 Primary osteoarthritis, right ankle and foot: Secondary | ICD-10-CM | POA: Diagnosis not present

## 2016-07-21 DIAGNOSIS — G902 Horner's syndrome: Secondary | ICD-10-CM | POA: Diagnosis not present

## 2016-07-21 DIAGNOSIS — M5021 Other cervical disc displacement,  high cervical region: Secondary | ICD-10-CM | POA: Diagnosis not present

## 2016-07-21 DIAGNOSIS — M9951 Intervertebral disc stenosis of neural canal of cervical region: Secondary | ICD-10-CM | POA: Diagnosis not present

## 2016-07-21 DIAGNOSIS — M5031 Other cervical disc degeneration,  high cervical region: Secondary | ICD-10-CM | POA: Diagnosis not present

## 2016-07-21 DIAGNOSIS — M2578 Osteophyte, vertebrae: Secondary | ICD-10-CM | POA: Diagnosis not present

## 2016-07-21 DIAGNOSIS — G9520 Unspecified cord compression: Secondary | ICD-10-CM | POA: Diagnosis not present

## 2016-07-21 DIAGNOSIS — Z880 Allergy status to penicillin: Secondary | ICD-10-CM | POA: Diagnosis not present

## 2016-09-03 DIAGNOSIS — M542 Cervicalgia: Secondary | ICD-10-CM | POA: Diagnosis not present

## 2016-09-03 DIAGNOSIS — M5020 Other cervical disc displacement, unspecified cervical region: Secondary | ICD-10-CM | POA: Diagnosis not present

## 2016-09-14 ENCOUNTER — Other Ambulatory Visit: Payer: Self-pay | Admitting: Gynecology

## 2016-09-14 DIAGNOSIS — Z1231 Encounter for screening mammogram for malignant neoplasm of breast: Secondary | ICD-10-CM

## 2016-10-04 ENCOUNTER — Ambulatory Visit
Admission: RE | Admit: 2016-10-04 | Discharge: 2016-10-04 | Disposition: A | Discharge status: Home / Self Care | Payer: BLUE CROSS/BLUE SHIELD | Source: Ambulatory Visit | Attending: Gynecology | Admitting: Gynecology

## 2016-10-04 DIAGNOSIS — Z1231 Encounter for screening mammogram for malignant neoplasm of breast: Secondary | ICD-10-CM

## 2016-10-14 DIAGNOSIS — G902 Horner's syndrome: Secondary | ICD-10-CM | POA: Diagnosis not present

## 2016-10-14 DIAGNOSIS — H02432 Paralytic ptosis of left eyelid: Secondary | ICD-10-CM | POA: Diagnosis not present

## 2016-11-19 DIAGNOSIS — H02401 Unspecified ptosis of right eyelid: Secondary | ICD-10-CM | POA: Diagnosis not present

## 2016-11-19 DIAGNOSIS — H02432 Paralytic ptosis of left eyelid: Secondary | ICD-10-CM | POA: Diagnosis not present

## 2016-11-19 DIAGNOSIS — G902 Horner's syndrome: Secondary | ICD-10-CM | POA: Diagnosis not present

## 2017-05-18 DIAGNOSIS — B9689 Other specified bacterial agents as the cause of diseases classified elsewhere: Secondary | ICD-10-CM | POA: Diagnosis not present

## 2017-05-18 DIAGNOSIS — J329 Chronic sinusitis, unspecified: Secondary | ICD-10-CM | POA: Diagnosis not present

## 2017-10-05 ENCOUNTER — Encounter: Payer: Self-pay | Admitting: Gynecology

## 2017-10-05 ENCOUNTER — Ambulatory Visit (INDEPENDENT_AMBULATORY_CARE_PROVIDER_SITE_OTHER): Payer: BLUE CROSS/BLUE SHIELD | Admitting: Gynecology

## 2017-10-05 VITALS — BP 118/76 | Ht 63.0 in | Wt 142.0 lb

## 2017-10-05 DIAGNOSIS — Z13220 Encounter for screening for lipoid disorders: Secondary | ICD-10-CM

## 2017-10-05 DIAGNOSIS — Z01419 Encounter for gynecological examination (general) (routine) without abnormal findings: Secondary | ICD-10-CM | POA: Diagnosis not present

## 2017-10-05 DIAGNOSIS — Z1151 Encounter for screening for human papillomavirus (HPV): Secondary | ICD-10-CM | POA: Diagnosis not present

## 2017-10-05 NOTE — Addendum Note (Signed)
Addended by: Nelva Nay on: 10/05/2017 04:50 PM   Modules accepted: Orders

## 2017-10-05 NOTE — Progress Notes (Signed)
    Lauren Bernard [birth year] [id number]        52 y.o.  [id number] for annual gynecologic exam.  Doing well without gynecologic complaints.  Past medical history,surgical history, problem list, medications, allergies, family history and social history were all reviewed and documented as reviewed in the EPIC chart.  ROS:  Performed with pertinent positives and negatives included in the history, assessment and plan.   Additional significant findings : None   Exam: Caryn Bee assistant Vitals:   10/05/17 1613  BP: 118/76  Weight: 142 lb (64.4 kg)  Height: 5\' 3"  (1.6 m)   Body mass index is 25.15 kg/m.  General appearance:  Normal affect, orientation and appearance. Skin: Grossly normal HEENT: Without gross lesions.  No cervical or supraclavicular adenopathy. Thyroid normal.  Lungs:  Clear without wheezing, rales or rhonchi Cardiac: RR, without RMG Abdominal:  Soft, nontender, without masses, guarding, rebound, organomegaly or hernia Breasts:  Examined lying and sitting without masses, retractions, discharge or axillary adenopathy. Pelvic:  Ext, BUS, Vagina: Normal  Cervix: Normal.  Pap smear/HPV  Uterus: Anteverted, normal size, shape and contour, midline and mobile nontender   Adnexa: Without masses or tenderness    Anus and perineum: Normal   Rectovaginal: Normal sphincter tone without palpated masses or tenderness.    Assessment/Plan:  52 y.o. G81P2002 female for annual gynecologic exam with regular menses, tubal sterilization.   1. Continues with regular menses.  Has skipped 1 or 2 over the past year.  No significant hot flushes or night sweats.  We discussed the perimenopause and what to expect.  She will keep a menstrual calendar as long as regular or less frequent but no prolonged or atypical bleeding and no significant menopausal symptoms then will follow.  If any issues she will follow-up for discussion/evaluation. 2. Pap smear/HPV 2014.  Pap smear/HPV today.  No history  of significant abnormal Pap smears. 3. Colonoscopy due now and she received a letter from the gastroenterologist.  Patient will arrange over this coming year. 4. Mammography due now and patient is going to call and schedule.  Breast exam normal today.  Family history of breast cancer in mother and maternal aunt.  The patient was genetically tested and negative. 5. Health maintenance.  Requests baseline labs.  CBC, CMP and lipid profile ordered.  Follow-up 1 year, sooner as needed.   Anastasio Auerbach MD, 4:41 PM 10/05/2017

## 2017-10-05 NOTE — Patient Instructions (Signed)
Your colonoscopy over this coming year.  Schedule your mammography  Follow-up in 1 year for annual exam

## 2017-10-06 LAB — CBC WITH DIFFERENTIAL/PLATELET
BASOS PCT: 0.8 %
Basophils Absolute: 86 cells/uL (ref 0–200)
EOS ABS: 86 {cells}/uL (ref 15–500)
EOS PCT: 0.8 %
HCT: 38 % (ref 35.0–45.0)
HEMOGLOBIN: 13.1 g/dL (ref 11.7–15.5)
Lymphs Abs: 2996 cells/uL (ref 850–3900)
MCH: 31.8 pg (ref 27.0–33.0)
MCHC: 34.5 g/dL (ref 32.0–36.0)
MCV: 92.2 fL (ref 80.0–100.0)
MPV: 10.6 fL (ref 7.5–12.5)
Monocytes Relative: 5.9 %
Neutro Abs: 6902 cells/uL (ref 1500–7800)
Neutrophils Relative %: 64.5 %
Platelets: 379 10*3/uL (ref 140–400)
RBC: 4.12 10*6/uL (ref 3.80–5.10)
RDW: 12.3 % (ref 11.0–15.0)
Total Lymphocyte: 28 %
WBC mixed population: 631 cells/uL (ref 200–950)
WBC: 10.7 10*3/uL (ref 3.8–10.8)

## 2017-10-06 LAB — PAP IG AND HPV HIGH-RISK: HPV DNA HIGH RISK: NOT DETECTED

## 2017-10-06 LAB — COMPREHENSIVE METABOLIC PANEL
AG RATIO: 1.9 (calc) (ref 1.0–2.5)
ALT: 16 U/L (ref 6–29)
AST: 19 U/L (ref 10–35)
Albumin: 4.6 g/dL (ref 3.6–5.1)
Alkaline phosphatase (APISO): 77 U/L (ref 33–130)
BUN: 10 mg/dL (ref 7–25)
CHLORIDE: 101 mmol/L (ref 98–110)
CO2: 28 mmol/L (ref 20–32)
Calcium: 9.9 mg/dL (ref 8.6–10.4)
Creat: 0.75 mg/dL (ref 0.50–1.05)
GLOBULIN: 2.4 g/dL (ref 1.9–3.7)
Glucose, Bld: 86 mg/dL (ref 65–99)
POTASSIUM: 3.8 mmol/L (ref 3.5–5.3)
SODIUM: 138 mmol/L (ref 135–146)
TOTAL PROTEIN: 7 g/dL (ref 6.1–8.1)
Total Bilirubin: 0.8 mg/dL (ref 0.2–1.2)

## 2017-10-06 LAB — LIPID PANEL
CHOL/HDL RATIO: 4.4 (calc) (ref <5.0)
Cholesterol: 260 mg/dL — ABNORMAL HIGH (ref <200)
HDL: 59 mg/dL (ref >50)
LDL Cholesterol (Calc): 173 mg/dL (calc) — ABNORMAL HIGH
Non-HDL Cholesterol (Calc): 201 mg/dL (calc) — ABNORMAL HIGH (ref <130)
Triglycerides: 141 mg/dL (ref <150)

## 2017-10-07 ENCOUNTER — Other Ambulatory Visit: Payer: Self-pay | Admitting: *Deleted

## 2017-10-07 DIAGNOSIS — E7800 Pure hypercholesterolemia, unspecified: Secondary | ICD-10-CM

## 2017-10-11 ENCOUNTER — Other Ambulatory Visit: Payer: Self-pay

## 2017-10-19 ENCOUNTER — Other Ambulatory Visit: Payer: BLUE CROSS/BLUE SHIELD

## 2017-10-19 DIAGNOSIS — E7800 Pure hypercholesterolemia, unspecified: Secondary | ICD-10-CM | POA: Diagnosis not present

## 2017-10-19 LAB — LIPID PANEL
CHOL/HDL RATIO: 4 (calc) (ref <5.0)
Cholesterol: 237 mg/dL — ABNORMAL HIGH (ref <200)
HDL: 59 mg/dL (ref >50)
LDL CHOLESTEROL (CALC): 151 mg/dL — AB
Non-HDL Cholesterol (Calc): 178 mg/dL (calc) — ABNORMAL HIGH (ref <130)
TRIGLYCERIDES: 142 mg/dL (ref <150)

## 2017-10-20 ENCOUNTER — Encounter: Payer: Self-pay | Admitting: Gynecology

## 2017-11-18 ENCOUNTER — Other Ambulatory Visit: Payer: Self-pay | Admitting: Gynecology

## 2017-11-18 DIAGNOSIS — Z1231 Encounter for screening mammogram for malignant neoplasm of breast: Secondary | ICD-10-CM

## 2017-12-23 ENCOUNTER — Ambulatory Visit
Admission: RE | Admit: 2017-12-23 | Discharge: 2017-12-23 | Disposition: A | Discharge status: Home / Self Care | Payer: BLUE CROSS/BLUE SHIELD | Source: Ambulatory Visit | Attending: Gynecology | Admitting: Gynecology

## 2017-12-23 ENCOUNTER — Other Ambulatory Visit: Payer: Self-pay

## 2017-12-23 DIAGNOSIS — Z1231 Encounter for screening mammogram for malignant neoplasm of breast: Secondary | ICD-10-CM | POA: Diagnosis not present

## 2017-12-26 ENCOUNTER — Other Ambulatory Visit: Payer: Self-pay | Admitting: Gynecology

## 2017-12-26 DIAGNOSIS — R928 Other abnormal and inconclusive findings on diagnostic imaging of breast: Secondary | ICD-10-CM

## 2017-12-29 ENCOUNTER — Ambulatory Visit: Payer: BLUE CROSS/BLUE SHIELD

## 2017-12-29 ENCOUNTER — Ambulatory Visit
Admission: RE | Admit: 2017-12-29 | Discharge: 2017-12-29 | Disposition: A | Discharge status: Home / Self Care | Payer: BLUE CROSS/BLUE SHIELD | Source: Ambulatory Visit | Attending: Gynecology | Admitting: Gynecology

## 2017-12-29 DIAGNOSIS — R922 Inconclusive mammogram: Secondary | ICD-10-CM | POA: Diagnosis not present

## 2017-12-29 DIAGNOSIS — R928 Other abnormal and inconclusive findings on diagnostic imaging of breast: Secondary | ICD-10-CM

## 2018-05-16 DIAGNOSIS — B9689 Other specified bacterial agents as the cause of diseases classified elsewhere: Secondary | ICD-10-CM | POA: Diagnosis not present

## 2018-05-16 DIAGNOSIS — J019 Acute sinusitis, unspecified: Secondary | ICD-10-CM | POA: Diagnosis not present

## 2018-07-22 DIAGNOSIS — R51 Headache: Secondary | ICD-10-CM | POA: Diagnosis not present

## 2018-07-22 DIAGNOSIS — R110 Nausea: Secondary | ICD-10-CM | POA: Diagnosis not present

## 2018-07-22 DIAGNOSIS — J3489 Other specified disorders of nose and nasal sinuses: Secondary | ICD-10-CM | POA: Diagnosis not present

## 2018-12-20 ENCOUNTER — Encounter: Payer: Self-pay | Admitting: Gynecology

## 2019-02-13 ENCOUNTER — Other Ambulatory Visit: Payer: Self-pay | Admitting: Gynecology

## 2019-02-13 DIAGNOSIS — Z1231 Encounter for screening mammogram for malignant neoplasm of breast: Secondary | ICD-10-CM

## 2019-04-09 ENCOUNTER — Ambulatory Visit
Admission: RE | Admit: 2019-04-09 | Discharge: 2019-04-09 | Disposition: A | Discharge status: Home / Self Care | Payer: BLUE CROSS/BLUE SHIELD | Source: Ambulatory Visit | Attending: Gynecology | Admitting: Gynecology

## 2019-04-09 ENCOUNTER — Other Ambulatory Visit: Payer: Self-pay

## 2019-04-09 DIAGNOSIS — Z1231 Encounter for screening mammogram for malignant neoplasm of breast: Secondary | ICD-10-CM

## 2019-06-27 ENCOUNTER — Other Ambulatory Visit: Payer: Self-pay

## 2019-06-28 ENCOUNTER — Encounter: Payer: BLUE CROSS/BLUE SHIELD | Admitting: Obstetrics and Gynecology

## 2019-07-25 ENCOUNTER — Encounter: Payer: BLUE CROSS/BLUE SHIELD | Admitting: Obstetrics and Gynecology

## 2019-09-25 ENCOUNTER — Other Ambulatory Visit: Payer: Self-pay

## 2019-09-26 ENCOUNTER — Ambulatory Visit (INDEPENDENT_AMBULATORY_CARE_PROVIDER_SITE_OTHER): Payer: BLUE CROSS/BLUE SHIELD | Admitting: Obstetrics and Gynecology

## 2019-09-26 ENCOUNTER — Encounter: Payer: Self-pay | Admitting: Obstetrics and Gynecology

## 2019-09-26 VITALS — BP 124/80 | Ht 63.0 in | Wt 150.0 lb

## 2019-09-26 DIAGNOSIS — N951 Menopausal and female climacteric states: Secondary | ICD-10-CM | POA: Diagnosis not present

## 2019-09-26 DIAGNOSIS — Z01419 Encounter for gynecological examination (general) (routine) without abnormal findings: Secondary | ICD-10-CM | POA: Diagnosis not present

## 2019-09-26 DIAGNOSIS — Z13220 Encounter for screening for lipoid disorders: Secondary | ICD-10-CM

## 2019-09-26 NOTE — Progress Notes (Signed)
Lauren Bernard Gover 1965-04-18 [id number]  SUBJECTIVE:  54 y.o. G2P2002 female for annual routine gynecologic exam and Pap smear. She has been having hot flashes, night sweats, mood changes, sleep pattern changes, in addition to menstrual irregularity as described below.  Current Outpatient Medications  Medication Sig Dispense Refill  . ibuprofen (ADVIL,MOTRIN) 800 MG tablet Take 1 tablet (800 mg total) by mouth every 6 (six) hours as needed for mild pain or cramping. 30 tablet 2  . Multiple Vitamin (MULTIVITAMIN) tablet Take 1 tablet by mouth daily.     No current facility-administered medications for this visit.   Allergies: Amoxicillin  Patient's last menstrual period was 07/31/2019.  Past medical history,surgical history, problem list, medications, allergies, family history and social history were all reviewed and documented as reviewed in the EPIC chart.  ROS:  Feeling well. No dyspnea or chest pain on exertion.  No abdominal pain, change in bowel habits, black or bloody stools.  No urinary tract symptoms. GYN ROS: + abnormal bleeding, pelvic pain or discharge, no breast pain or new or enlarging lumps on self exam. No neurological complaints.    OBJECTIVE:  BP 124/80 (BP Location: Right Arm, Patient Position: Sitting, Cuff Size: Normal)   Ht 5\' 3"  (1.6 m)   Wt 150 lb (68 kg)   LMP 07/31/2019   BMI 26.57 kg/m  The patient appears well, alert, oriented x 3, in no distress. ENT normal.  Neck supple. No cervical or supraclavicular adenopathy or thyromegaly.  Lungs are clear, good air entry, no wheezes, rhonchi or rales. S1 and S2 normal, no murmurs, regular rate and rhythm.  Abdomen soft without tenderness, guarding, mass or organomegaly.  Neurological is normal, no focal findings.  BREAST EXAM: breasts appear normal, no suspicious masses, no skin or nipple changes or axillary nodes  PELVIC EXAM: VULVA: normal appearing vulva with no masses, tenderness or lesions, VAGINA:  normal appearing vagina with normal color and discharge, no lesions, CERVIX: normal appearing cervix without discharge or lesions, UTERUS: uterus is normal size, shape, consistency and nontender, ADNEXA: normal adnexa in size, nontender and no masses  Chaperone: Caryn Bee present during the examination  ASSESSMENT:  54 y.o. [id number] here for annual gynecologic exam  PLAN:   1. Perimenopausal.  Prior tubal ligation.  Typically very regular periods, up until last summer missed about 2-3 months before having a period.  He is reassured that she is having very typical perimenopausal symptoms.  No excessive bleeding at this time, but her menstrual pattern has been changing.  We discussed menopause is defined as 1 year of amenorrhea and reviewed the typical symptoms to expect including menstrual pattern changes.  If any prolonged/atypical bleeding she should let us know as further evaluation should be performed in that case.  She is managing all right for now, we did briefly review some of the potential treatments to help alleviate the perimenopausal symptoms.  OTC products also discussed. 2. Pap smear/HPV 09/2017.  No significant history of abnormal Pap smears.  Next Pap smear due 2024 following the current guidelines recommending the 5 year interval. 3. Mammogram 03/2019.  Normal breast exam today.  Continue with annual mammograms. 4. Colonoscopy 11 years ago.  Recommended that she follow up now as is overdue.  She says she is going to GI for a consult in July in preparation for repeat colonoscopy. 5. Health maintenance.  She will proceed to lab today for routine screening blood work (lipids, CBC, CMP).  Return annually or sooner, prn.  Joseph Pierini MD 09/26/19

## 2019-09-26 NOTE — Patient Instructions (Signed)

## 2019-09-27 LAB — LIPID PANEL
Cholesterol: 231 mg/dL — ABNORMAL HIGH (ref <200)
HDL: 44 mg/dL — ABNORMAL LOW (ref >=50)
LDL Cholesterol (Calc): 146 mg/dL (calc) — ABNORMAL HIGH
Non-HDL Cholesterol (Calc): 187 mg/dL (calc) — ABNORMAL HIGH (ref <130)
Total CHOL/HDL Ratio: 5.3 (calc) — ABNORMAL HIGH (ref <5.0)
Triglycerides: 267 mg/dL — ABNORMAL HIGH (ref <150)

## 2019-09-27 LAB — CBC
HCT: 39.8 % (ref 35.0–45.0)
Hemoglobin: 13.1 g/dL (ref 11.7–15.5)
MCH: 30.8 pg (ref 27.0–33.0)
MCHC: 32.9 g/dL (ref 32.0–36.0)
MCV: 93.4 fL (ref 80.0–100.0)
MPV: 10.5 fL (ref 7.5–12.5)
Platelets: 321 10*3/uL (ref 140–400)
RBC: 4.26 10*6/uL (ref 3.80–5.10)
RDW: 11.8 % (ref 11.0–15.0)
WBC: 8.3 10*3/uL (ref 3.8–10.8)

## 2019-09-27 LAB — COMPREHENSIVE METABOLIC PANEL
AG Ratio: 1.8 (calc) (ref 1.0–2.5)
ALT: 20 U/L (ref 6–29)
AST: 20 U/L (ref 10–35)
Albumin: 4.7 g/dL (ref 3.6–5.1)
Alkaline phosphatase (APISO): 89 U/L (ref 37–153)
BUN: 9 mg/dL (ref 7–25)
CO2: 28 mmol/L (ref 20–32)
Calcium: 10.2 mg/dL (ref 8.6–10.4)
Chloride: 102 mmol/L (ref 98–110)
Creat: 0.85 mg/dL (ref 0.50–1.05)
Globulin: 2.6 g/dL (calc) (ref 1.9–3.7)
Glucose, Bld: 86 mg/dL (ref 65–99)
Potassium: 4.1 mmol/L (ref 3.5–5.3)
Sodium: 139 mmol/L (ref 135–146)
Total Bilirubin: 0.9 mg/dL (ref 0.2–1.2)
Total Protein: 7.3 g/dL (ref 6.1–8.1)

## 2019-10-02 ENCOUNTER — Other Ambulatory Visit: Payer: Self-pay

## 2019-10-02 DIAGNOSIS — E782 Mixed hyperlipidemia: Secondary | ICD-10-CM

## 2019-10-11 DIAGNOSIS — Z1211 Encounter for screening for malignant neoplasm of colon: Secondary | ICD-10-CM | POA: Diagnosis not present

## 2019-11-26 ENCOUNTER — Other Ambulatory Visit: Payer: BLUE CROSS/BLUE SHIELD

## 2019-11-26 ENCOUNTER — Other Ambulatory Visit: Payer: Self-pay

## 2019-11-26 DIAGNOSIS — E782 Mixed hyperlipidemia: Secondary | ICD-10-CM

## 2019-11-27 LAB — LIPID PANEL
Cholesterol: 238 mg/dL — ABNORMAL HIGH (ref <200)
HDL: 52 mg/dL (ref >=50)
LDL Cholesterol (Calc): 156 mg/dL (calc) — ABNORMAL HIGH
Non-HDL Cholesterol (Calc): 186 mg/dL (calc) — ABNORMAL HIGH (ref <130)
Total CHOL/HDL Ratio: 4.6 (calc) (ref <5.0)
Triglycerides: 164 mg/dL — ABNORMAL HIGH (ref <150)

## 2019-12-21 DIAGNOSIS — K5730 Diverticulosis of large intestine without perforation or abscess without bleeding: Secondary | ICD-10-CM | POA: Diagnosis not present

## 2019-12-21 DIAGNOSIS — Z1211 Encounter for screening for malignant neoplasm of colon: Secondary | ICD-10-CM | POA: Diagnosis not present

## 2020-10-29 ENCOUNTER — Other Ambulatory Visit: Payer: Self-pay | Admitting: Obstetrics and Gynecology

## 2020-10-29 DIAGNOSIS — Z1231 Encounter for screening mammogram for malignant neoplasm of breast: Secondary | ICD-10-CM

## 2020-12-19 ENCOUNTER — Ambulatory Visit
Admission: RE | Admit: 2020-12-19 | Discharge: 2020-12-19 | Disposition: A | Discharge status: Home / Self Care | Payer: BLUE CROSS/BLUE SHIELD | Source: Ambulatory Visit | Attending: Obstetrics and Gynecology | Admitting: Obstetrics and Gynecology

## 2020-12-19 ENCOUNTER — Other Ambulatory Visit: Payer: Self-pay

## 2020-12-19 DIAGNOSIS — Z1231 Encounter for screening mammogram for malignant neoplasm of breast: Secondary | ICD-10-CM | POA: Diagnosis not present

## 2021-01-14 DIAGNOSIS — S93401A Sprain of unspecified ligament of right ankle, initial encounter: Secondary | ICD-10-CM | POA: Diagnosis not present

## 2021-02-16 ENCOUNTER — Ambulatory Visit: Payer: BLUE CROSS/BLUE SHIELD | Admitting: Obstetrics and Gynecology

## 2021-03-02 DIAGNOSIS — B9689 Other specified bacterial agents as the cause of diseases classified elsewhere: Secondary | ICD-10-CM | POA: Diagnosis not present

## 2021-03-02 DIAGNOSIS — J019 Acute sinusitis, unspecified: Secondary | ICD-10-CM | POA: Diagnosis not present

## 2021-03-02 DIAGNOSIS — R0981 Nasal congestion: Secondary | ICD-10-CM | POA: Diagnosis not present

## 2021-03-17 NOTE — Progress Notes (Signed)
55 y.o. G2P2002 Married Caucasian female here for annual exam.   ° °Patient went 8 months with no cycles and then began spotting occasionally, two months in a row.  Then would skip a month and then light spotting again.  °Some night sweats but no hot flashes during the day.  ° °2 sons in college.  ° °PCP:   None--needs referral ° °Patient's last menstrual period was 12/27/2020 (approximate).     °Period Pattern: (!) Irregular °    °Sexually active: Yes.    °The current method of family planning is tubal ligation.    °Exercising: No.  The patient does not participate in regular exercise at present. °Smoker:  no ° °Health Maintenance: °Pap:  10-05-17 Neg:Neg HR HPV, 04-28-12 Neg:Neg HR HPV °History of abnormal Pap:  no °MMG:  12-19-20 Neg/BiRads1 °Colonoscopy:  12-21-19 normal;10 years °BMD:   n/a  Result  n/a °TDaP:  Unsure °Gardasil:   no °HIV:no °Hep C:no °Screening Labs: PCP. °Flu vaccine:  declined due to egg allergy. ° ° reports that she has never smoked. She has never used smokeless tobacco. She reports current alcohol use. She reports that she does not use drugs. ° °Past Medical History:  °Diagnosis Date  ° Arthritis   ° Toe  ° BRCA negative 07/27/2013  ° Hyperlipidemia   ° ° °Past Surgical History:  °Procedure Laterality Date  ° CERVICAL FUSION    ° LAPAROSCOPIC BILATERAL SALPINGECTOMY Bilateral 08/29/2013  ° Procedure: LAPAROSCOPIC BILATERAL SALPINGECTOMY;  Surgeon: Timothy P Fontaine, MD;  Location: [facility];  Service: Gynecology;  Laterality: Bilateral;  ° LAPAROSCOPIC OVARIAN CYSTECTOMY Right 08/29/2013  ° Procedure: LAPAROSCOPIC OVARIAN CYSTECTOMY;  Surgeon: Timothy P Fontaine, MD;  Location: [facility];  Service: Gynecology;  Laterality: Right;  Dr. Fernandez to assist.  ° ° °Current Outpatient Medications  °Medication Sig Dispense Refill  ° ALREX 0.2 % SUSP Apply to eye.    ° ibuprofen (ADVIL,MOTRIN) 800 MG tablet Take 1 tablet (800 mg total) by mouth every 6 (six) hours as needed for mild pain or cramping. 30  tablet 2  ° Multiple Vitamin (MULTIVITAMIN) tablet Take 1 tablet by mouth daily.    ° °No current facility-administered medications for this visit.  ° ° °Family History  °Problem Relation Age of Onset  ° Breast cancer Mother 45  ° Breast cancer Maternal Aunt 57  ° ° °Review of Systems  °All other systems reviewed and are negative. ° °Exam:   °BP 120/78    Pulse 79    Ht 5' 2" (1.575 m)    Wt 149 lb (67.6 kg)    LMP 12/27/2020 (Approximate)    SpO2 100%    BMI 27.25 kg/m²     °General appearance: alert, cooperative and appears stated age °Head: normocephalic, without obvious abnormality, atraumatic °Neck: no adenopathy, supple, symmetrical, trachea midline and thyroid normal to inspection and palpation °Lungs: clear to auscultation bilaterally °Breasts: normal appearance, no masses or tenderness, No nipple retraction or dimpling, No nipple discharge or bleeding, No axillary adenopathy °Heart: regular rate and rhythm °Abdomen: soft, non-tender; no masses, no organomegaly °Extremities: extremities normal, atraumatic, no cyanosis or edema °Skin: skin color, texture, turgor normal. No rashes or lesions °Lymph nodes: cervical, supraclavicular, and axillary nodes normal. °Neurologic: grossly normal ° °Pelvic: External genitalia:  no lesions °             No abnormal inguinal nodes palpated. °             Urethra:    normal appearing urethra with no masses, tenderness or lesions °             Bartholins and Skenes: normal    °             Vagina: normal appearing vagina with normal color and discharge, no lesions °             Cervix: no lesions °             Pap taken: no °Bimanual Exam:  Uterus:  normal size, contour, position, consistency, mobility, non-tender °             Adnexa: no mass, fullness, tenderness °             Rectal exam: yes.  Confirms. °             Anus:  normal sphincter tone, no lesions ° °Chaperone was present for exam:  Amanda, CMA ° °Assessment:   °Well woman visit with gynecologic exam. °Status  post bilateral salpingectomy.  °Perimenopausal female.  °FH breast cancer mother and maternal aunt.  °Patient is BRCA negative.  °Hyperlipidemia. ° °Plan: °Mammogram screening 3D discussed. °Self breast awareness reviewed. °Pap and HR HPV  2024. °Guidelines for Calcium, Vitamin D, regular exercise program including cardiovascular and weight bearing exercise. °Will check FSH and estradiol. °She will establish care with a PCP.  List of providers given. °She will do labs with her new PCP. °We discussed exercise and a Mediterranean diet to lower cholesterol and triglycerides.   °Flu vaccine at local pharmacy. °Follow up annually and prn.  ° °After visit summary provided.  ° ° ° °

## 2021-03-19 ENCOUNTER — Ambulatory Visit (INDEPENDENT_AMBULATORY_CARE_PROVIDER_SITE_OTHER): Payer: BC Managed Care – PPO | Admitting: Obstetrics and Gynecology

## 2021-03-19 ENCOUNTER — Encounter: Payer: Self-pay | Admitting: Obstetrics and Gynecology

## 2021-03-19 ENCOUNTER — Other Ambulatory Visit: Payer: Self-pay

## 2021-03-19 VITALS — BP 120/78 | HR 79 | Ht 62.0 in | Wt 149.0 lb

## 2021-03-19 DIAGNOSIS — Z01419 Encounter for gynecological examination (general) (routine) without abnormal findings: Secondary | ICD-10-CM

## 2021-03-19 DIAGNOSIS — N951 Menopausal and female climacteric states: Secondary | ICD-10-CM | POA: Diagnosis not present

## 2021-03-19 DIAGNOSIS — N926 Irregular menstruation, unspecified: Secondary | ICD-10-CM | POA: Diagnosis not present

## 2021-03-19 NOTE — Patient Instructions (Addendum)
Mediterranean Diet °A Mediterranean diet refers to food and lifestyle choices that are based on the traditions of countries located on the [place]. It focuses on eating more fruits, vegetables, whole grains, beans, nuts, seeds, and heart-healthy fats, and eating less dairy, meat, eggs, and processed foods with added sugar, salt, and fat. This way of eating has been shown to help prevent certain conditions and improve outcomes for people who have chronic diseases, like kidney disease and heart disease. °What are tips for following this plan? °Reading food labels °Check the serving size of packaged foods. For foods such as rice and pasta, the serving size refers to the amount of cooked product, not dry. °Check the total fat in packaged foods. Avoid foods that have saturated fat or trans fats. °Check the ingredient list for added sugars, such as corn syrup. °Shopping ° °Buy a variety of foods that offer a balanced diet, including: °Fresh fruits and vegetables (produce). °Grains, beans, nuts, and seeds. Some of these may be available in unpackaged forms or large amounts (in bulk). °Fresh seafood. °Poultry and eggs. °Low-fat dairy products. °Buy whole ingredients instead of prepackaged foods. °Buy fresh fruits and vegetables in-season from local farmers markets. °Buy plain frozen fruits and vegetables. °If you do not have access to quality fresh seafood, buy precooked frozen shrimp or canned fish, such as tuna, salmon, or sardines. °Stock your pantry so you always have certain foods on hand, such as olive oil, canned tuna, canned tomatoes, rice, pasta, and beans. °Cooking °Cook foods with extra-virgin olive oil instead of using butter or other vegetable oils. °Have meat as a side dish, and have vegetables or grains as your main dish. This means having meat in small portions or adding small amounts of meat to foods like pasta or stew. °Use beans or vegetables instead of meat in common dishes like chili or  lasagna. °Experiment with different cooking methods. Try roasting, broiling, steaming, and sautéing vegetables. °Add frozen vegetables to soups, stews, pasta, or rice. °Add nuts or seeds for added healthy fats and plant protein at each meal. You can add these to yogurt, salads, or vegetable dishes. °Marinate fish or vegetables using olive oil, lemon juice, garlic, and fresh herbs. °Meal planning °Plan to eat one vegetarian meal one day each week. Try to work up to two vegetarian meals, if possible. °Eat seafood two or more times a week. °Have healthy snacks readily available, such as: °Vegetable sticks with hummus. °Greek yogurt. °Fruit and nut trail mix. °Eat balanced meals throughout the week. This includes: °Fruit: 2-3 servings a day. °Vegetables: 4-5 servings a day. °Low-fat dairy: 2 servings a day. °Fish, poultry, or lean meat: 1 serving a day. °Beans and legumes: 2 or more servings a week. °Nuts and seeds: 1-2 servings a day. °Whole grains: 6-8 servings a day. °Extra-virgin olive oil: 3-4 servings a day. °Limit red meat and sweets to only a few servings a month. °Lifestyle ° °Cook and eat meals together with your family, when possible. °Drink enough fluid to keep your urine pale yellow. °Be physically active every day. This includes: °Aerobic exercise like running or swimming. °Leisure activities like gardening, walking, or housework. °Get 7-8 hours of sleep each night. °If recommended by your health care provider, drink red wine in moderation. This means 1 glass a day for nonpregnant women and 2 glasses a day for men. A glass of wine equals 5 oz (150 mL). °What foods should I eat? °Fruits °Apples. Apricots. Avocado. Berries. Bananas. Cherries. Dates.   Figs. Grapes. Lemons. Melon. Oranges. Peaches. Plums. Pomegranate. Vegetables Artichokes. Beets. Broccoli. Cabbage. Carrots. Eggplant. Green beans. Chard. Kale. Spinach. Onions. Leeks. Peas. Squash. Tomatoes. Peppers. Radishes. Grains Whole-grain pasta. Brown  rice. Bulgur wheat. Polenta. Couscous. Whole-wheat bread. [place]. Meats and other proteins Beans. Almonds. Sunflower seeds. Pine nuts. Peanuts. [place]. Salmon. Scallops. Shrimp. [place]. Tilapia. Clams. Oysters. Eggs. Poultry without skin. Dairy Low-fat milk. Cheese. Greek yogurt. Fats and oils Extra-virgin olive oil. Avocado oil. Grapeseed oil. Beverages Water. Red wine. Herbal tea. Sweets and desserts Greek yogurt with honey. Baked apples. Poached pears. Trail mix. Seasonings and condiments Basil. Cilantro. Coriander. Cumin. Mint. Parsley. Sage. Rosemary. Tarragon. Garlic. Oregano. Thyme. Pepper. Balsamic vinegar. Tahini. Hummus. Tomato sauce. Olives. Mushrooms. The items listed above may not be a complete list of foods and beverages you can eat. Contact a dietitian for more information. What foods should I limit? This is a list of foods that should be eaten rarely or only on special occasions. Fruits Fruit canned in syrup. Vegetables Deep-fried potatoes (french fries). Grains Prepackaged pasta or rice dishes. Prepackaged cereal with added sugar. Prepackaged snacks with added sugar. Meats and other proteins Beef. Pork. Lamb. Poultry with skin. Hot dogs. Berniece Salines. Dairy Ice cream. Sour cream. Whole milk. Fats and oils Butter. Canola oil. Vegetable oil. Beef fat (tallow). Lard. Beverages Juice. Sugar-sweetened soft drinks. Beer. Liquor and spirits. Sweets and desserts Cookies. Cakes. Pies. Candy. Seasonings and condiments Mayonnaise. Pre-made sauces and marinades. The items listed above may not be a complete list of foods and beverages you should limit. Contact a dietitian for more information. Summary The Mediterranean diet includes both food and lifestyle choices. Eat a variety of fresh fruits and vegetables, beans, nuts, seeds, and whole grains. Limit the amount of red meat and sweets that you eat. If recommended by your health care provider, drink red wine in moderation.  This means 1 glass a day for nonpregnant women and 2 glasses a day for men. A glass of wine equals 5 oz (150 mL). This information is not intended to replace advice given to you by your health care provider. Make sure you discuss any questions you have with your health care provider. Document Revised: 04/20/2019 Document Reviewed: 02/15/2019 Elsevier Patient Education  2022 [place] DIET:  We recommended that you start or continue a regular exercise program for good health. Regular exercise means any activity that makes your heart beat faster and makes you sweat.  We recommend exercising at least 30 minutes per day at least 3 days a week, preferably 4 or 5.  We also recommend a diet low in fat and sugar.  Inactivity, poor dietary choices and obesity can cause diabetes, heart attack, stroke, and kidney damage, among others.    ALCOHOL AND SMOKING:  Women should limit their alcohol intake to no more than 7 drinks/beers/glasses of wine (combined, not each!) per week. Moderation of alcohol intake to this level decreases your risk of breast cancer and liver damage. And of course, no recreational drugs are part of a healthy lifestyle.  And absolutely no smoking or even second hand smoke. Most people know smoking can cause heart and lung diseases, but did you know it also contributes to weakening of your bones? Aging of your skin?  Yellowing of your teeth and nails?  CALCIUM AND VITAMIN D:  Adequate intake of calcium and Vitamin D are recommended.  The recommendations for exact amounts of these supplements seem to change often, but generally speaking 600  mg of calcium (either carbonate or citrate) and 800 units of Vitamin D per day seems prudent. Certain women may benefit from higher intake of Vitamin D.  If you are among these women, your doctor will have told you during your visit.    PAP SMEARS:  Pap smears, to check for cervical cancer or precancers,  have traditionally been done  yearly, although recent scientific advances have shown that most women can have pap smears less often.  However, every woman still should have a physical exam from her gynecologist every year. It will include a breast check, inspection of the vulva and vagina to check for abnormal growths or skin changes, a visual exam of the cervix, and then an exam to evaluate the size and shape of the uterus and ovaries.  And after 53 years of age, a rectal exam is indicated to check for rectal cancers. We will also provide age appropriate advice regarding health maintenance, like when you should have certain vaccines, screening for sexually transmitted diseases, bone density testing, colonoscopy, mammograms, etc.   MAMMOGRAMS:  All women over 48 years old should have a yearly mammogram. Many facilities now offer a "3D" mammogram, which may cost around $50 extra out of pocket. If possible,  we recommend you accept the option to have the 3D mammogram performed.  It both reduces the number of women who will be called back for extra views which then turn out to be normal, and it is better than the routine mammogram at detecting truly abnormal areas.    COLONOSCOPY:  Colonoscopy to screen for colon cancer is recommended for all women at age 20.  We know, you hate the idea of the prep.  We agree, BUT, having colon cancer and not knowing it is worse!!  Colon cancer so often starts as a polyp that can be seen and removed at colonscopy, which can quite literally save your life!  And if your first colonoscopy is normal and you have no family history of colon cancer, most women don't have to have it again for 10 years.  Once every ten years, you can do something that may end up saving your life, right?  We will be happy to help you get it scheduled when you are ready.  Be sure to check your insurance coverage so you understand how much it will cost.  It may be covered as a preventative service at no cost, but you should check your  particular policy.

## 2021-03-20 LAB — FOLLICLE STIMULATING HORMONE: FSH: 98.5 m[IU]/mL

## 2021-03-20 LAB — ESTRADIOL: Estradiol: 15 pg/mL

## 2022-08-16 ENCOUNTER — Other Ambulatory Visit: Payer: Self-pay | Admitting: Obstetrics and Gynecology

## 2022-08-16 DIAGNOSIS — Z1231 Encounter for screening mammogram for malignant neoplasm of breast: Secondary | ICD-10-CM

## 2022-09-03 ENCOUNTER — Ambulatory Visit: Payer: BLUE CROSS/BLUE SHIELD

## 2022-09-22 ENCOUNTER — Ambulatory Visit
Admission: RE | Admit: 2022-09-22 | Discharge: 2022-09-22 | Disposition: A | Discharge status: Home / Self Care | Payer: 59 | Source: Ambulatory Visit | Attending: Obstetrics and Gynecology | Admitting: Obstetrics and Gynecology

## 2022-09-22 DIAGNOSIS — Z1231 Encounter for screening mammogram for malignant neoplasm of breast: Secondary | ICD-10-CM

## 2022-12-04 IMAGING — MG MM DIGITAL SCREENING BILAT W/ TOMO AND CAD
8 series · 8 of 24 positions shown · non-contrast
Comparison: Previous exam(s).

CLINICAL DATA: Screening. ///

EXAM:
DIGITAL SCREENING BILATERAL MAMMOGRAM WITH TOMOSYNTHESIS AND CAD
TECHNIQUE: Bilateral screening digital craniocaudal and mediolateral oblique
mammograms were obtained. Bilateral screening digital breast
tomosynthesis was performed. The images were evaluated with
computer-aided detection.

[R CC synth-2D]
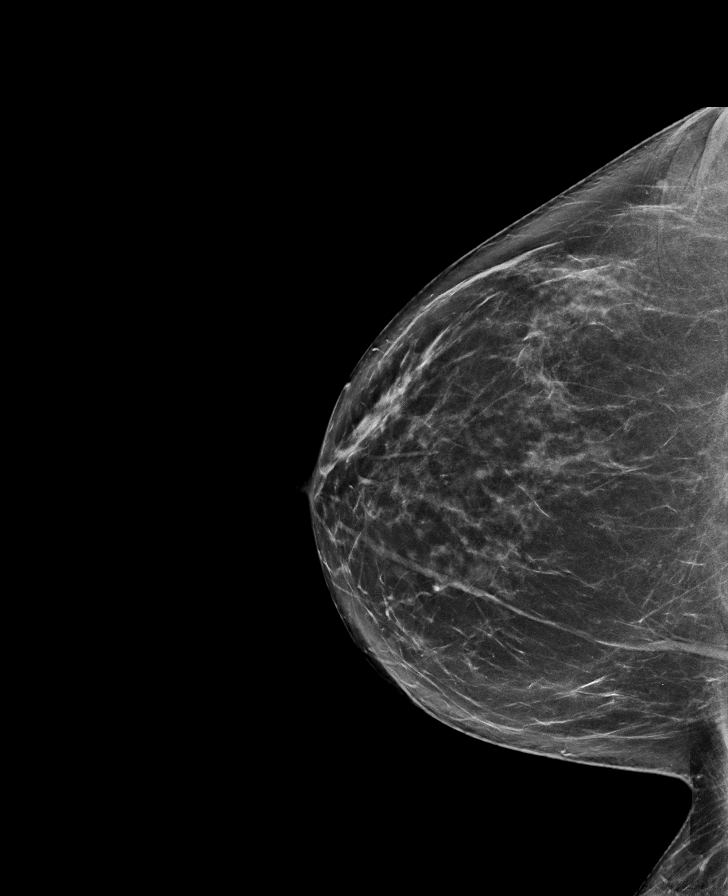

[L MLO synth-2D]
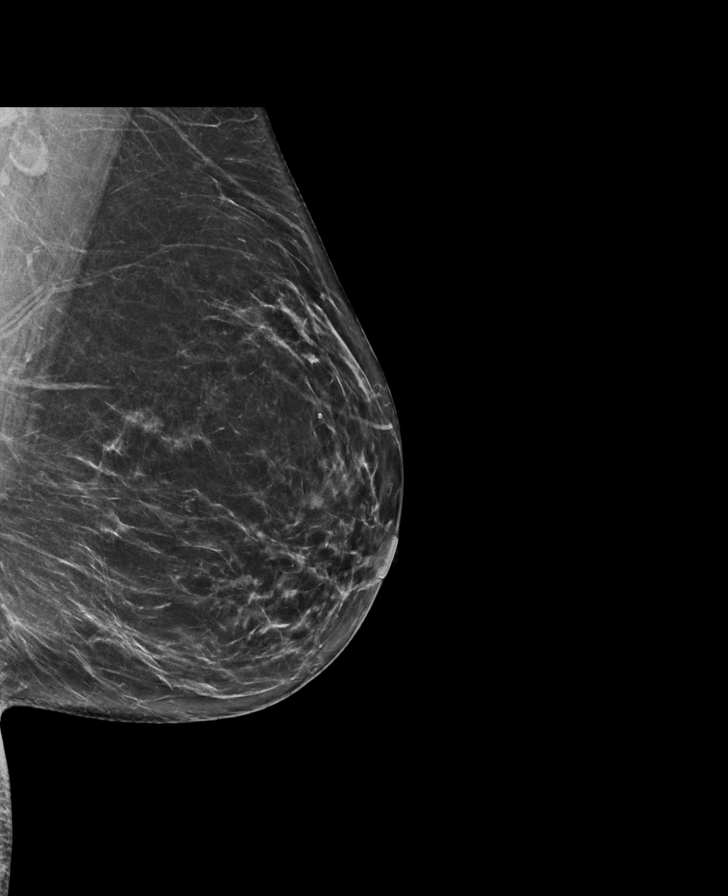

[R MLO synth-2D]
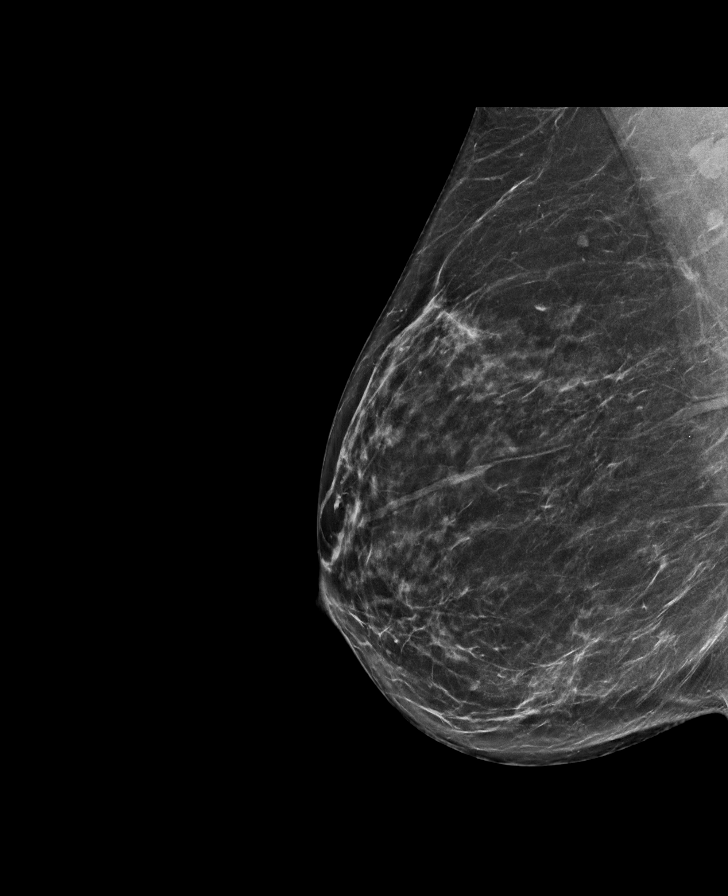

[L CC synth-2D]
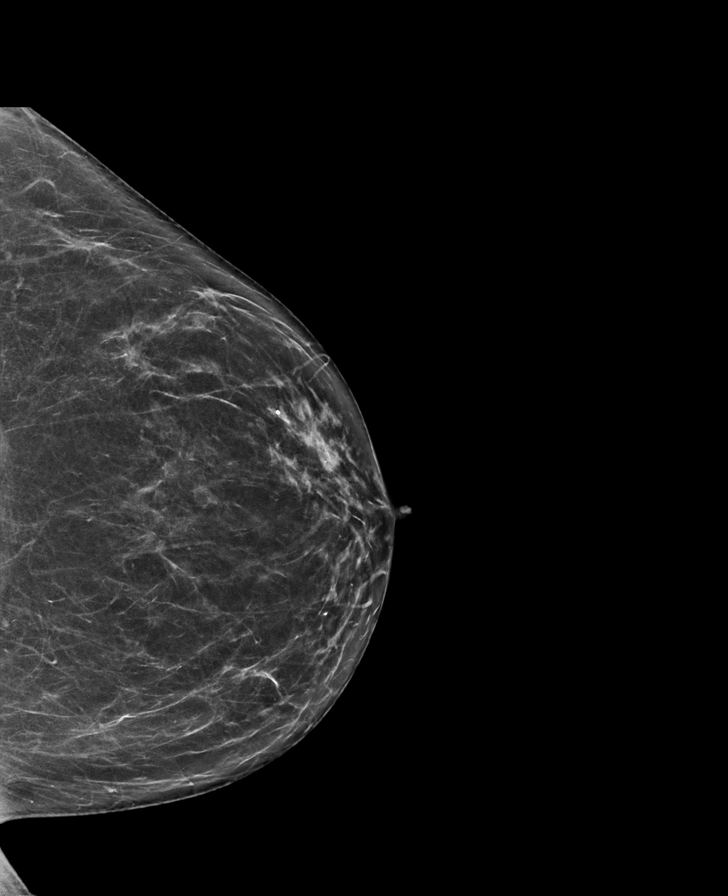

[L MLO tomo · tomo slice 37/73.0]
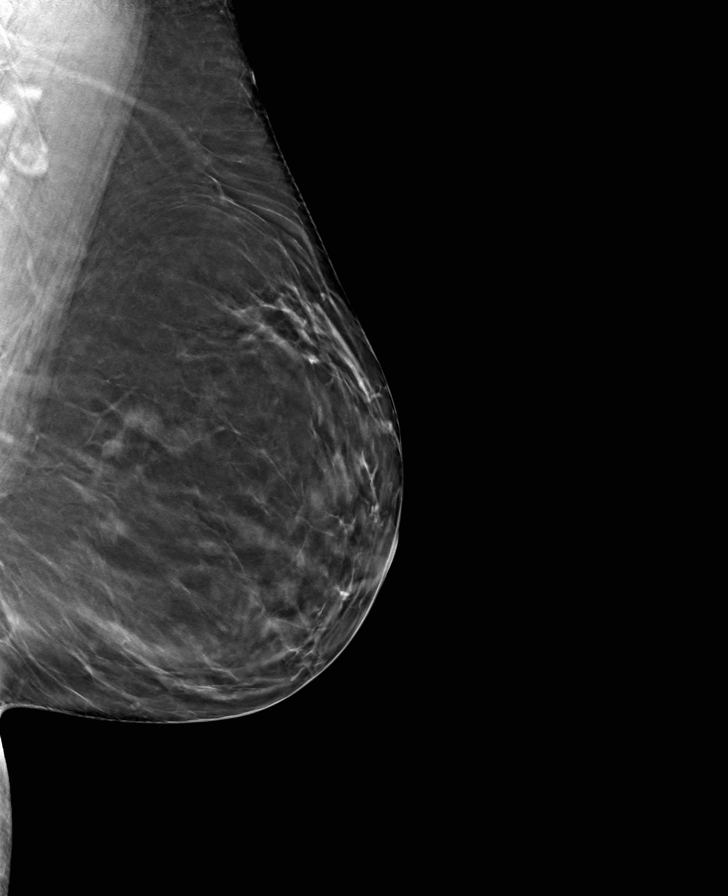

[R CC tomo · tomo slice 39/76.0]
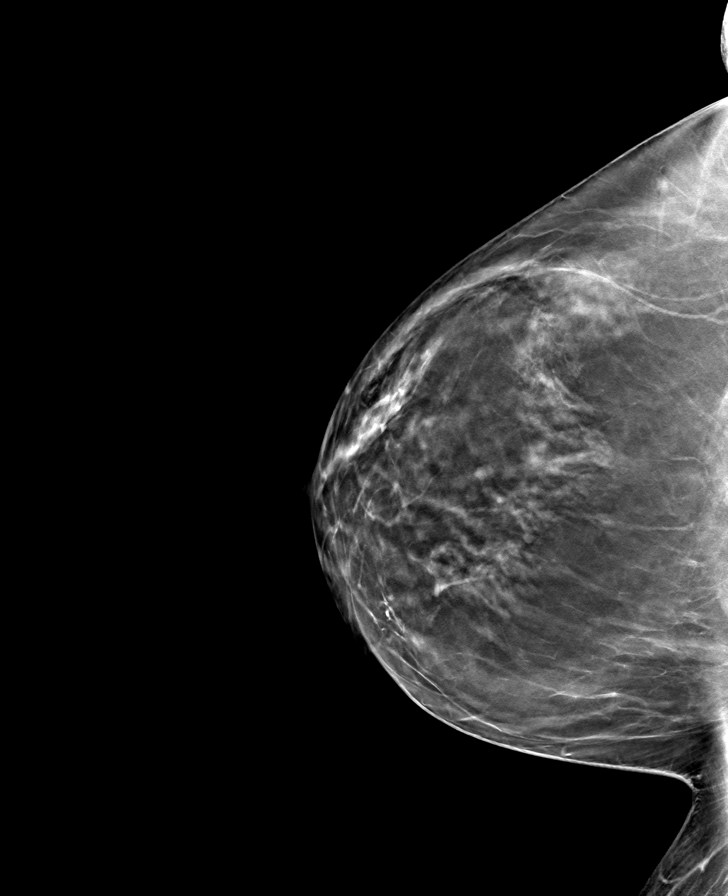

[L CC tomo · tomo slice 33/65.0]
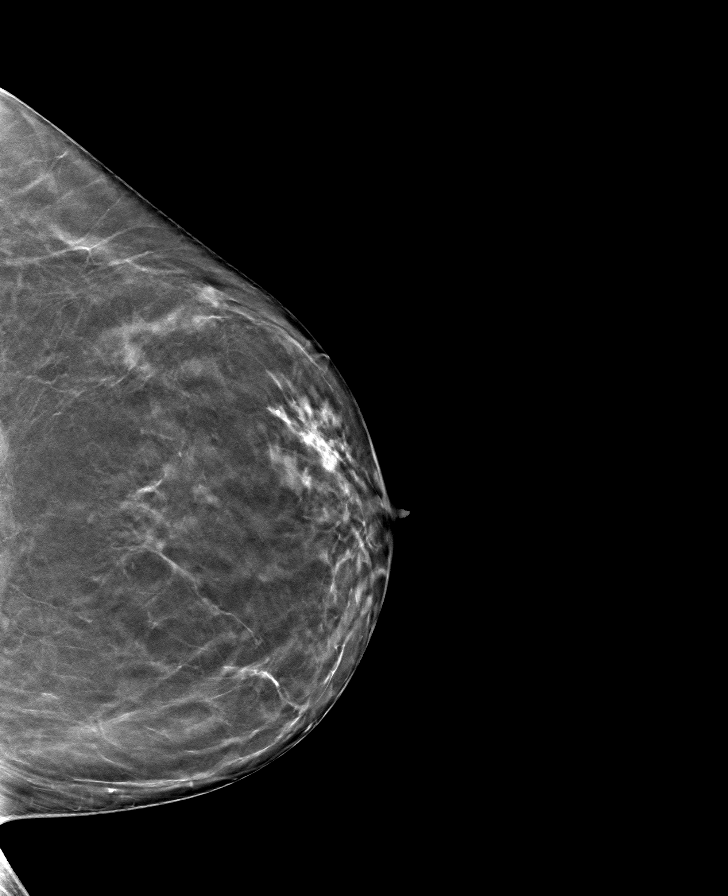

[R MLO tomo · tomo slice 37/74.0]
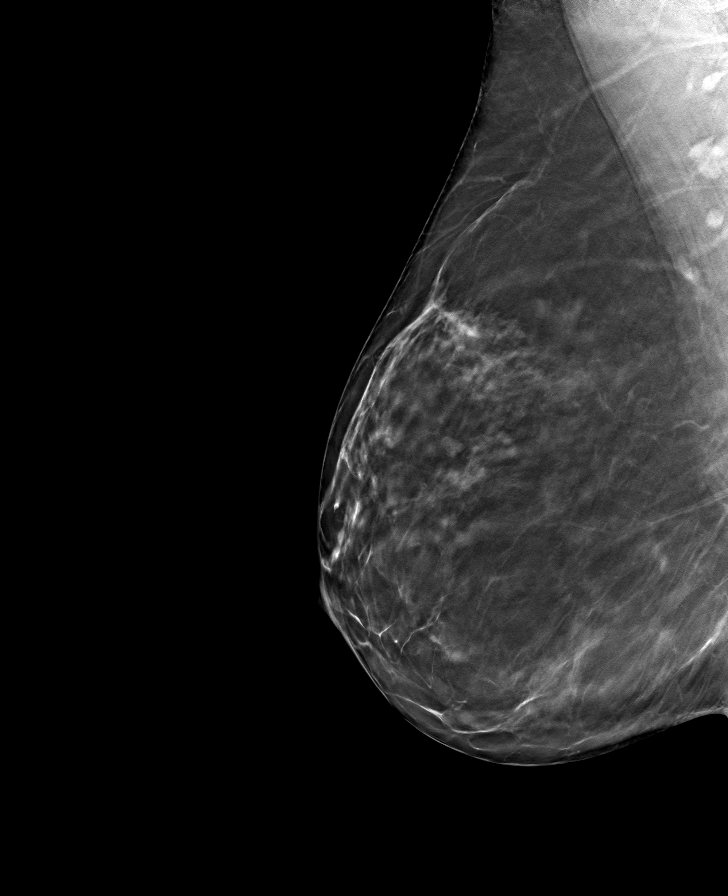

[8 of 24 positions shown; findings below may reference images not displayed]

ACR Breast Density Category b: There are scattered areas of
fibroglandular density.
FINDINGS: There are no findings suspicious for malignancy.
IMPRESSION: No mammographic evidence of malignancy. A result letter of this
screening mammogram will be mailed directly to the patient.

RECOMMENDATION:
Screening mammogram in one year. (Code:8D-[id number])

BI-RADS CATEGORY  1: Negative.

## 2023-02-28 ENCOUNTER — Ambulatory Visit: Payer: BC Managed Care – PPO | Admitting: Obstetrics and Gynecology

## 2023-03-14 NOTE — Progress Notes (Unsigned)
57 y.o. G46P2002 Married Caucasian female here for annual exam.    October 27, 2021 was her LMP.  Occasional hot flash or night sweat.  Some bilateral hip pain.   Her goal is to exercise more  Decreased interest in sex.   Some clear vaginal discharge.  No odor, burning, or itching.   Some lower abdominal cramping and low back pain.  Maybe some dysuria.   PCP: Patient, No Pcp Per   Patient's last menstrual period was 10/27/2021 (approximate).           Sexually active: Yes.    The current method of family planning is status post hysterectomy.    Menopausal hormone therapy:  n/a Exercising: Yes.     walking Smoker:  no  OB History  Gravida Para Term Preterm AB Living  2 2 2   2   SAB IAB Ectopic Multiple Live Births          # Outcome Date GA Lbr Len/2nd Weight Sex Type Anes PTL Lv  2 Term           1 Term              HEALTH MAINTENANCE: Last 2 paps:  10/05/17 neg: HR HPV neg, 04/28/12 neg History of abnormal Pap or positive HPV:  no Mammogram:   09/22/22 Breast Density Cat B, BI-RADS CAT 1 neg Colonoscopy:  12/21/19 - due in 10 years. Bone Density:  n/a  Result  n/a    There is no immunization history on file for this patient.    reports that she has never smoked. She has never used smokeless tobacco. She reports current alcohol use. She reports that she does not use drugs.  Past Medical History:  Diagnosis Date   Arthritis    Toe   BRCA negative 07/27/2013   Hyperlipidemia     Past Surgical History:  Procedure Laterality Date   CERVICAL FUSION     LAPAROSCOPIC BILATERAL SALPINGECTOMY Bilateral 08/29/2013   Procedure: LAPAROSCOPIC BILATERAL SALPINGECTOMY;  Surgeon: Dara Lords, MD;  Location: [facility];  Service: Gynecology;  Laterality: Bilateral;   LAPAROSCOPIC OVARIAN CYSTECTOMY Right 08/29/2013   Procedure: LAPAROSCOPIC OVARIAN CYSTECTOMY;  Surgeon: Dara Lords, MD;  Location: [facility];  Service: Gynecology;  Laterality: Right;  Dr. Lily Peer to  assist.    Current Outpatient Medications  Medication Sig Dispense Refill   ALREX 0.2 % SUSP Apply to eye.     B Complex-C (SUPER B COMPLEX PO) Take by mouth.     Bioflavonoid Products (BIOFLEX PO) Take by mouth.     ibuprofen (ADVIL,MOTRIN) 800 MG tablet Take 1 tablet (800 mg total) by mouth every 6 (six) hours as needed for mild pain or cramping. 30 tablet 2   Multiple Vitamin (MULTIVITAMIN) tablet Take 1 tablet by mouth daily.     No current facility-administered medications for this visit.    ALLERGIES: Amoxicillin  Family History  Problem Relation Age of Onset   Breast cancer Mother 27   Breast cancer Maternal Aunt 37    Review of Systems  All other systems reviewed and are negative.   PHYSICAL EXAM:  BP 124/80 (BP Location: Left Arm, Patient Position: Sitting, Cuff Size: Small)   Pulse 73   Ht 5\' 4"  (1.626 m)   Wt 153 lb (69.4 kg)   LMP 10/27/2021 (Approximate)   SpO2 97%   BMI 26.26 kg/m     General appearance: alert, cooperative and appears stated age Head: normocephalic,  without obvious abnormality, atraumatic Neck: no adenopathy, supple, symmetrical, trachea midline and thyroid normal to inspection and palpation Lungs: clear to auscultation bilaterally Breasts: normal appearance, no masses or tenderness, No nipple retraction or dimpling, No nipple discharge or bleeding, No axillary adenopathy Heart: regular rate and rhythm Abdomen: soft, non-tender; no masses, no organomegaly Extremities: extremities normal, atraumatic, no cyanosis or edema Skin: skin color, texture, turgor normal. No rashes or lesions Lymph nodes: cervical, supraclavicular, and axillary nodes normal. Neurologic: grossly normal  Pelvic: External genitalia:  no lesions              No abnormal inguinal nodes palpated.              Urethra:  normal appearing urethra with no masses, tenderness or lesions              Bartholins and Skenes: normal                 Vagina: normal appearing  vagina with normal color and discharge, no lesions              Cervix: no lesions              Pap taken: {yes no:314532} Bimanual Exam:  Uterus:  normal size, contour, position, consistency, mobility, non-tender              Adnexa: no mass, fullness, tenderness              Rectal exam: {yes no:314532}.  Confirms.              Anus:  normal sphincter tone, no lesions  Chaperone was present for exam:  {BSCHAPERONE:31226::"Emily F, CMA"}  ASSESSMENT: Well woman visit with gynecologic exam Status post bilateral salpingectomy.  Perimenopausal female.  FH breast cancer mother and maternal aunt.  Patient is BRCA negative.  Hyperlipidemia. ***  PLAN: Mammogram screening discussed. Self breast awareness reviewed. Pap and HRV collected:  {yes no:314532} Guidelines for Calcium, Vitamin D, regular exercise program including cardiovascular and weight bearing exercise. Medication refills:  *** {LABS (Optional):23779} Follow up:  ***    Additional counseling given.  {yes [id number]. ***  total time was spent for this patient encounter, including preparation, face-to-face counseling with the patient, coordination of care, and documentation of the encounter in addition to doing the well woman visit with gynecologic exam.

## 2023-03-28 ENCOUNTER — Ambulatory Visit (INDEPENDENT_AMBULATORY_CARE_PROVIDER_SITE_OTHER): Payer: 59 | Admitting: Obstetrics and Gynecology

## 2023-03-28 ENCOUNTER — Encounter: Payer: Self-pay | Admitting: Obstetrics and Gynecology

## 2023-03-28 ENCOUNTER — Other Ambulatory Visit (HOSPITAL_COMMUNITY)
Admission: RE | Admit: 2023-03-28 | Discharge: 2023-03-28 | Disposition: A | Discharge status: Home / Self Care | Payer: 59 | Source: Ambulatory Visit | Attending: Obstetrics and Gynecology | Admitting: Obstetrics and Gynecology

## 2023-03-28 VITALS — BP 124/80 | HR 73 | Ht 64.0 in | Wt 153.0 lb

## 2023-03-28 DIAGNOSIS — Z124 Encounter for screening for malignant neoplasm of cervix: Secondary | ICD-10-CM | POA: Diagnosis present

## 2023-03-28 DIAGNOSIS — R300 Dysuria: Secondary | ICD-10-CM

## 2023-03-28 DIAGNOSIS — Z87440 Personal history of urinary (tract) infections: Secondary | ICD-10-CM

## 2023-03-28 DIAGNOSIS — R102 Pelvic and perineal pain: Secondary | ICD-10-CM | POA: Diagnosis not present

## 2023-03-28 DIAGNOSIS — Z01419 Encounter for gynecological examination (general) (routine) without abnormal findings: Secondary | ICD-10-CM

## 2023-03-28 DIAGNOSIS — N898 Other specified noninflammatory disorders of vagina: Secondary | ICD-10-CM

## 2023-03-28 NOTE — Patient Instructions (Signed)

## 2023-03-29 ENCOUNTER — Encounter: Payer: Self-pay | Admitting: Obstetrics and Gynecology

## 2023-03-29 ENCOUNTER — Other Ambulatory Visit: Payer: Self-pay | Admitting: Obstetrics and Gynecology

## 2023-03-29 LAB — CERVICOVAGINAL ANCILLARY ONLY
Bacterial Vaginitis (gardnerella): NEGATIVE
Candida Glabrata: NEGATIVE
Candida Vaginitis: NEGATIVE
Comment: NEGATIVE
Comment: NEGATIVE
Comment: NEGATIVE
Comment: NEGATIVE
Trichomonas: NEGATIVE

## 2023-03-29 MED ORDER — SULFAMETHOXAZOLE-TRIMETHOPRIM 800-160 MG PO TABS: 1.0000 | ORAL_TABLET | Freq: Two times a day (BID) | ORAL | 0 refills | Status: AC

## 2023-03-29 NOTE — Progress Notes (Signed)
 Rx for Bactrim DS po bid x 3 days.

## 2023-03-30 LAB — URINALYSIS, COMPLETE W/RFL CULTURE
Bilirubin Urine: NEGATIVE
Casts: NONE SEEN /[LPF]
Crystals: NONE SEEN /HPF
Glucose, UA: NEGATIVE
Hgb urine dipstick: NEGATIVE
Ketones, ur: NEGATIVE
Leukocyte Esterase: NEGATIVE
Nitrites, Initial: NEGATIVE
Protein, ur: NEGATIVE
RBC / HPF: NONE SEEN /[HPF] (ref 0–2)
Specific Gravity, Urine: 1.01 (ref 1.001–1.035)
Yeast: NONE SEEN /HPF
pH: 5.5 (ref 5.0–8.0)

## 2023-03-30 LAB — URINE CULTURE
MICRO NUMBER:: 15899876
Result:: NO GROWTH
SPECIMEN QUALITY:: ADEQUATE

## 2023-03-30 LAB — CULTURE INDICATED

## 2023-03-31 LAB — CYTOLOGY - PAP
Comment: NEGATIVE
Diagnosis: NEGATIVE
High risk HPV: NEGATIVE

## 2024-04-02 ENCOUNTER — Ambulatory Visit: Payer: 59 | Admitting: Obstetrics and Gynecology
